# Patient Record
Sex: Male | Born: 1975 | Race: White | Marital: Married | Smoking: Former smoker
Health system: Southern US, Community
[De-identification: ages and names within clinical notes are randomized; demographics above are authoritative.]

## PROBLEM LIST (undated history)

## (undated) DIAGNOSIS — T7840XA Allergy, unspecified, initial encounter: Secondary | ICD-10-CM

## (undated) DIAGNOSIS — J45909 Unspecified asthma, uncomplicated: Secondary | ICD-10-CM

## (undated) DIAGNOSIS — K219 Gastro-esophageal reflux disease without esophagitis: Secondary | ICD-10-CM

## (undated) DIAGNOSIS — M722 Plantar fascial fibromatosis: Secondary | ICD-10-CM

## (undated) DIAGNOSIS — G4730 Sleep apnea, unspecified: Secondary | ICD-10-CM

## (undated) DIAGNOSIS — G4733 Obstructive sleep apnea (adult) (pediatric): Secondary | ICD-10-CM

## (undated) DIAGNOSIS — M5137 Other intervertebral disc degeneration, lumbosacral region: Secondary | ICD-10-CM

## (undated) DIAGNOSIS — G5603 Carpal tunnel syndrome, bilateral upper limbs: Secondary | ICD-10-CM

## (undated) DIAGNOSIS — Z9989 Dependence on other enabling machines and devices: Principal | ICD-10-CM

## (undated) HISTORY — DX: Other intervertebral disc degeneration, lumbosacral region: M51.37

## (undated) HISTORY — DX: Gastro-esophageal reflux disease without esophagitis: K21.9

## (undated) HISTORY — DX: Unspecified asthma, uncomplicated: J45.909

## (undated) HISTORY — PX: OTHER SURGICAL HISTORY: SHX169

## (undated) HISTORY — DX: Carpal tunnel syndrome, bilateral upper limbs: G56.03

## (undated) HISTORY — PX: COLONOSCOPY: SHX174

## (undated) HISTORY — DX: Dependence on other enabling machines and devices: Z99.89

## (undated) HISTORY — DX: Plantar fascial fibromatosis: M72.2

## (undated) HISTORY — DX: Obstructive sleep apnea (adult) (pediatric): G47.33

## (undated) HISTORY — DX: Allergy, unspecified, initial encounter: T78.40XA

## (undated) HISTORY — DX: Sleep apnea, unspecified: G47.30

---

## 2015-04-11 ENCOUNTER — Other Ambulatory Visit: Payer: Self-pay | Admitting: Orthopedic Surgery

## 2015-04-11 DIAGNOSIS — G8929 Other chronic pain: Secondary | ICD-10-CM

## 2015-04-11 DIAGNOSIS — M545 Low back pain: Principal | ICD-10-CM

## 2015-05-09 ENCOUNTER — Ambulatory Visit
Admission: RE | Admit: 2015-05-09 | Discharge: 2015-05-09 | Disposition: A | Discharge status: Home / Self Care | Payer: BLUE CROSS/BLUE SHIELD | Source: Ambulatory Visit | Attending: Orthopedic Surgery | Admitting: Orthopedic Surgery

## 2015-05-09 ENCOUNTER — Other Ambulatory Visit: Payer: Self-pay | Admitting: Orthopedic Surgery

## 2015-05-09 VITALS — BP 154/72 | HR 85 | Temp 97.7°F | Resp 20

## 2015-05-09 DIAGNOSIS — G8929 Other chronic pain: Secondary | ICD-10-CM

## 2015-05-09 DIAGNOSIS — M545 Low back pain, unspecified: Secondary | ICD-10-CM

## 2015-05-09 MED ORDER — FENTANYL CITRATE (PF) 100 MCG/2ML IJ SOLN
25.0000 ug | INTRAMUSCULAR | Status: DC | PRN
  Administered 2015-05-09: 100 ug via INTRAVENOUS

## 2015-05-09 MED ORDER — CEFAZOLIN SODIUM-DEXTROSE 2-3 GM-% IV SOLR
2.0000 g | Freq: Once | INTRAVENOUS | Status: AC
  Administered 2015-05-09: 2 g via INTRAVENOUS

## 2015-05-09 MED ORDER — KETOROLAC TROMETHAMINE 30 MG/ML IJ SOLN
30.0000 mg | Freq: Once | INTRAMUSCULAR | Status: AC
  Administered 2015-05-09: 30 mg via INTRAVENOUS

## 2015-05-09 MED ORDER — MEPERIDINE HCL 100 MG/ML IJ SOLN
100.0000 mg | Freq: Once | INTRAMUSCULAR | Status: AC
  Administered 2015-05-09: 100 mg via INTRAMUSCULAR

## 2015-05-09 MED ORDER — IOHEXOL 180 MG/ML  SOLN: 3.5000 mL | Freq: Once | INTRAMUSCULAR | Status: DC | PRN

## 2015-05-09 MED ORDER — SODIUM CHLORIDE 0.9 % IV SOLN
Freq: Once | INTRAVENOUS | Status: AC
  Administered 2015-05-09: 08:00:00 via INTRAVENOUS

## 2015-05-09 MED ORDER — MIDAZOLAM HCL 2 MG/2ML IJ SOLN
1.0000 mg | INTRAMUSCULAR | Status: DC | PRN
  Administered 2015-05-09: 1 mg via INTRAVENOUS

## 2015-05-09 MED ORDER — ONDANSETRON HCL 4 MG/2ML IJ SOLN
4.0000 mg | Freq: Once | INTRAMUSCULAR | Status: AC
Start: 2015-05-09 — End: 2015-05-09
  Administered 2015-05-09: 4 mg via INTRAMUSCULAR

## 2015-05-09 NOTE — Discharge Instructions (Signed)
Discogram Post Procedure Discharge Instructions ° °1. May resume a regular diet and any medications that you routinely take (including pain medications). °2. No driving day of procedure. °3. Upon discharge go home and rest for at least 4 hours.  May use an ice pack as needed to injection sites on back.  Ice to back 30 minutes on and 30 minutes off, all day. °4. May remove bandades later, today. °5. It is not unusual to be sore for several days after this procedure. ° ° ° °Please contact our office at [phone] for the following symptoms: ° °· Fever greater than 100 degrees °· Increased swelling, pain, or redness at injection site. ° ° °Thank you for visiting [facility]. ° ° °

## 2015-11-08 ENCOUNTER — Encounter
Payer: BLUE CROSS/BLUE SHIELD | Attending: Physical Medicine & Rehabilitation | Admitting: Physical Medicine & Rehabilitation

## 2015-11-08 ENCOUNTER — Encounter: Payer: Self-pay | Admitting: Physical Medicine & Rehabilitation

## 2015-11-08 VITALS — BP 147/81 | HR 77

## 2015-11-08 DIAGNOSIS — R200 Anesthesia of skin: Secondary | ICD-10-CM | POA: Insufficient documentation

## 2015-11-08 DIAGNOSIS — G8929 Other chronic pain: Secondary | ICD-10-CM | POA: Insufficient documentation

## 2015-11-08 DIAGNOSIS — R202 Paresthesia of skin: Secondary | ICD-10-CM | POA: Diagnosis not present

## 2015-11-08 DIAGNOSIS — Z87891 Personal history of nicotine dependence: Secondary | ICD-10-CM | POA: Insufficient documentation

## 2015-11-08 DIAGNOSIS — G894 Chronic pain syndrome: Secondary | ICD-10-CM | POA: Diagnosis not present

## 2015-11-08 DIAGNOSIS — M545 Low back pain: Secondary | ICD-10-CM | POA: Insufficient documentation

## 2015-11-08 DIAGNOSIS — M51379 Other intervertebral disc degeneration, lumbosacral region without mention of lumbar back pain or lower extremity pain: Secondary | ICD-10-CM | POA: Insufficient documentation

## 2015-11-08 DIAGNOSIS — M5137 Other intervertebral disc degeneration, lumbosacral region: Secondary | ICD-10-CM | POA: Diagnosis not present

## 2015-11-08 HISTORY — DX: Other intervertebral disc degeneration, lumbosacral region: M51.37

## 2015-11-08 HISTORY — DX: Other intervertebral disc degeneration, lumbosacral region without mention of lumbar back pain or lower extremity pain: M51.379

## 2015-11-08 NOTE — Progress Notes (Signed)
Subjective:    Patient ID: James Webster, male    DOB: 01-23-1976, 40 y.o.   MRN: [id number]  HPI  39 year old male with past medical of DDD, surgical history of low back pain presents for evaluation. It started in summer 2013.  He denies inciting events.  He had some trauma soon after that exacerbated the symptoms.  The pain is progressively worse since 2015.  Heat, TENs, and rest improve the pain.  Prolonged activity and postures exacerbate the pain.  He describes the pain as achy, but can be sharp.  He has ?radiation to his posterior thigh.  Denies associated weakness, numbness, tingling.  Pain is constant.  6/10.  He does not like taking medications, but has taken flexeril, hydrocodone, tramadol at times, which helps.  He now has a sedentary job.   He has had ESIs in the past, but did not receive benefit. He had an MRI which showed mild to moderate degeneration at L5-S1. He is not a surgical candidate. He also physical therapy in the past without significant benefit. He also had a discogram, which had non-specific findings per pt. He saw a pain doctor, but did not feel comfortable, so never went back.   The pain inhibits pt from doing activities that he enjoys in life.  Pain Inventory Average Pain 8 Pain Right Now 8 My pain is sharp, stabbing and aching  In the last 24 hours, has pain interfered with the following? General activity 7 Relation with others 5 Enjoyment of life 8 What TIME of day is your pain at its worst? morning and evening Sleep (in general) Poor  Pain is worse with: walking, bending, sitting and standing Pain improves with: rest, heat/ice, medication and TENS Relief from Meds: 7  Mobility walk without assistance ability to climb steps?  yes do you drive?  yes  Function employed # of hrs/week 40 what is your job? [employer]  Neuro/Psych numbness tingling  Prior Studies Any changes since last visit?  no  Physicians involved in your care Any  changes since last visit?  no   History reviewed. No pertinent family history. Social History   Social History  . Marital Status: Married    Spouse Name: N/A  . Number of Children: N/A  . Years of Education: N/A   Social History Main Topics  . Smoking status: Former Smoker -- 1.00 packs/day for 3 years    Types: Cigarettes    Quit date: 05/08/1996  . Smokeless tobacco: Never Used  . Alcohol Use: None  . Drug Use: None  . Sexual Activity: Not Asked   Other Topics Concern  . None   Social History Narrative   History reviewed. No pertinent past surgical history. History reviewed. No pertinent past medical history. BP 147/81 mmHg  Pulse 77  SpO2 98%  Opioid Risk Score:   Fall Risk Score:  `1  Depression screen PHQ 2/9  Depression screen PHQ 2/9 11/08/2015  Decreased Interest 1  Down, Depressed, Hopeless 0  PHQ - 2 Score 1  Altered sleeping 1  Tired, decreased energy 1  Change in appetite 0  Feeling bad or failure about yourself  0  Trouble concentrating 0  Moving slowly or fidgety/restless 0  Suicidal thoughts 0  PHQ-9 Score 3  Difficult doing work/chores Not difficult at all   Review of Systems  Constitutional: Negative for fever and chills.  Musculoskeletal: Positive for back pain. Negative for neck pain.  Neurological: Negative for weakness.  All other  systems reviewed and are negative.     Objective:   Physical Exam HENT: Normocephalic, Atraumatic Eyes: EOMI, Conj WNL Cardio: S1, S2 normal, RRR Pulm: B/l clear to auscultation.  Effort normal Abd: Soft, non-distended, non-tender, BS+ MSK:  Gait WNL.   TTP along lumbar PSP.    No edema.   ?FABERs b/l.  +Stork test  Neg Fortin's finger Neuro: CN II-XII grossly intact.    Sensation intact to light touch in all LE dermatomes  Reflexes 2+ throughout  Strength  5/5 in all LE myotomes  Skin: Warm and Dry    Assessment & Plan:  40 year old male with past medical of DDD, surgical history of low back  pain presents for evaluation.  1. Chronic mechanical low back pain  Has tried ESIs, Discogram and is not a surgical candidate  Cont heat and TENs  Pt will consider pool therapy at local [facility]  Referral to PT for exercises and possible traction  Pt does not currently require pain meds  Encouraged pt to use back brace for strenuous activity  Encouraged pt to cont ergonomic adjustments at work, potential use of standing desk in addition to sitting  Encouraged stretching during work  Will refer pt for [employer] consider facet injections in future  Will consider trigger point injections in future  2. Morbid obesity  Encouraged weight loss  Encouraged activity  Time spent with pt: 55 min, time counseling 40 minutes.

## 2015-12-12 DIAGNOSIS — Z0000 Encounter for general adult medical examination without abnormal findings: Secondary | ICD-10-CM | POA: Diagnosis not present

## 2015-12-13 DIAGNOSIS — J40 Bronchitis, not specified as acute or chronic: Secondary | ICD-10-CM | POA: Diagnosis not present

## 2015-12-13 DIAGNOSIS — J020 Streptococcal pharyngitis: Secondary | ICD-10-CM | POA: Diagnosis not present

## 2015-12-13 DIAGNOSIS — Z6841 Body Mass Index (BMI) 40.0 and over, adult: Secondary | ICD-10-CM | POA: Diagnosis not present

## 2015-12-17 DIAGNOSIS — Z23 Encounter for immunization: Secondary | ICD-10-CM | POA: Diagnosis not present

## 2015-12-17 DIAGNOSIS — M549 Dorsalgia, unspecified: Secondary | ICD-10-CM | POA: Diagnosis not present

## 2015-12-17 DIAGNOSIS — Z6841 Body Mass Index (BMI) 40.0 and over, adult: Secondary | ICD-10-CM | POA: Diagnosis not present

## 2015-12-17 DIAGNOSIS — Z0000 Encounter for general adult medical examination without abnormal findings: Secondary | ICD-10-CM | POA: Diagnosis not present

## 2015-12-17 DIAGNOSIS — J020 Streptococcal pharyngitis: Secondary | ICD-10-CM | POA: Diagnosis not present

## 2015-12-17 DIAGNOSIS — Z1389 Encounter for screening for other disorder: Secondary | ICD-10-CM | POA: Diagnosis not present

## 2015-12-19 ENCOUNTER — Encounter
Payer: BLUE CROSS/BLUE SHIELD | Attending: Physical Medicine & Rehabilitation | Admitting: Physical Medicine & Rehabilitation

## 2015-12-19 ENCOUNTER — Encounter: Payer: Self-pay | Admitting: Physical Medicine & Rehabilitation

## 2015-12-19 VITALS — BP 129/86 | Resp 14

## 2015-12-19 DIAGNOSIS — R202 Paresthesia of skin: Secondary | ICD-10-CM | POA: Insufficient documentation

## 2015-12-19 DIAGNOSIS — M545 Low back pain, unspecified: Secondary | ICD-10-CM

## 2015-12-19 DIAGNOSIS — R200 Anesthesia of skin: Secondary | ICD-10-CM | POA: Insufficient documentation

## 2015-12-19 DIAGNOSIS — Z87891 Personal history of nicotine dependence: Secondary | ICD-10-CM | POA: Insufficient documentation

## 2015-12-19 DIAGNOSIS — G8929 Other chronic pain: Secondary | ICD-10-CM | POA: Insufficient documentation

## 2015-12-19 DIAGNOSIS — IMO0001 Reserved for inherently not codable concepts without codable children: Secondary | ICD-10-CM

## 2015-12-19 DIAGNOSIS — M791 Myalgia: Secondary | ICD-10-CM

## 2015-12-19 DIAGNOSIS — G894 Chronic pain syndrome: Secondary | ICD-10-CM | POA: Diagnosis not present

## 2015-12-19 DIAGNOSIS — M609 Myositis, unspecified: Secondary | ICD-10-CM

## 2015-12-19 DIAGNOSIS — M5137 Other intervertebral disc degeneration, lumbosacral region: Secondary | ICD-10-CM | POA: Diagnosis not present

## 2015-12-19 NOTE — Progress Notes (Addendum)
Subjective:    Patient ID: James Webster, male    DOB: 04-Aug-1975, 40 y.o.   MRN: [id number]  HPI  40 year old male with past medical of DDD, surgical history of low back pain presents for for follow up. It started in summer 2013.  He denies inciting events.  He had some trauma soon after that exacerbated the symptoms.  The pain is progressively worse since 2015.  Heat, TENs, and rest improve the pain.  Prolonged activity and postures exacerbate the pain.  He describes the pain as achy, but can be sharp.  He has ?radiation to his posterior thigh.  Denies associated weakness, numbness, tingling.  Pain is constant.  He does not like taking medications, but has taken flexeril, hydrocodone, tramadol at times, which helps.  He now has a sedentary job.    He has had ESIs in the past, but did not receive benefit. He had an MRI which showed mild to moderate degeneration at L5-S1. He is not a surgical candidate. He also had physical therapy in the past without significant benefit. He also had a discogram, which had non-specific findings per pt. He saw a pain doctor, but did not feel comfortable, so never went back.   Last clinic visit 11/08/15.  Today, severity 4/10.  Since last visit, his pain is about the same.  He fell off a step on a ladder since last visit.  He has been doing Biomedical scientist.  He is on a steroids due to strep throat, which seems to improve the pain.  He has not followed up with pool therapy.  He did not go see PT.  He has not purchased a back brace.  His work place has made ergonomic adjustments, which has been helping.  He tries to stretch during work, but does not always manage to do so.  Pain Inventory Average Pain 7 Pain Right Now 7 My pain is constant, sharp, stabbing and aching  In the last 24 hours, has pain interfered with the following? General activity 7 Relation with others 5 Enjoyment of life 7 What TIME of day is your pain at its worst? evening Sleep (in general)  Poor  Pain is worse with: walking, bending, sitting, inactivity, standing and some activites Pain improves with: rest, heat/ice, medication and TENS Relief from Meds: 4  Mobility walk without assistance how many minutes can you walk? 2 ability to climb steps?  yes Do you have any goals in this area?  no  Function employed # of hrs/week 40 Do you have any goals in this area?  no  Neuro/Psych No problems in this area  Prior Studies Any changes since last visit?  no  Physicians involved in your care Neurosurgeon NA   History reviewed. No pertinent family history. Social History   Social History  . Marital Status: Married    Spouse Name: N/A  . Number of Children: N/A  . Years of Education: N/A   Social History Main Topics  . Smoking status: Former Smoker -- 1.00 packs/day for 3 years    Types: Cigarettes    Quit date: 05/08/1996  . Smokeless tobacco: Never Used  . Alcohol Use: None  . Drug Use: None  . Sexual Activity: Not Asked   Other Topics Concern  . None   Social History Narrative   History reviewed. No pertinent past surgical history. Past Medical History  Diagnosis Date  . DDD (degenerative disc disease), lumbosacral 11/08/2015   BP 129/86 mmHg  Resp 14  SpO2  96%  Opioid Risk Score:   Fall Risk Score:  `1  Depression screen PHQ 2/9  Depression screen [facility] 2/9 12/19/2015 11/08/2015  Decreased Interest 0 1  Down, Depressed, Hopeless 0 0  PHQ - 2 Score 0 1  Altered sleeping - 1  Tired, decreased energy - 1  Change in appetite - 0  Feeling bad or failure about yourself  - 0  Trouble concentrating - 0  Moving slowly or fidgety/restless - 0  Suicidal thoughts - 0  PHQ-9 Score - 3  Difficult doing work/chores - Not difficult at all   Review of Systems  Musculoskeletal: Positive for back pain.  All other systems reviewed and are negative.     Objective:   Physical Exam HENT: Normocephalic, Atraumatic Eyes: EOMI, Conj WNL Cardio: S1, S2  normal, RRR Pulm: B/l clear to auscultation.  Effort normal Abd: Soft, non-distended, non-tender, BS+ MSK:   Gait WNL.               TTP along lumbar PSP.                No edema.               ?FABERs b/l.             +Stork test             Neg Fortin's finger Neuro: CN II-XII grossly intact.                Sensation intact to light touch in all LE dermatomes             Reflexes 2+ throughout             Strength          5/5 in all LE myotomes Skin: Warm and Dry    Assessment & Plan:  40 year old male with past medical of DDD, surgical history of low back pain presents for follow up.  1. Chronic mechanical low back pain             Has tried ESIs, Discogram and is not a surgical candidate             Cont heat and TENs  Ergonomic adjustments at work, providing benefit.              Pt will follow up with pool therapy [facility] (did not do so on last visit)  Cont  stretching during work  Pt to follow up with PT for exercises and possible traction              Pt does not currently require pain meds at present             Encouraged pt to use back brace for strenuous activity (again)  Awaiting [employer]             Will consider facet injections in future  2. Morbid obesity             Encouraged weight loss             Encouraged activity  3. Myalgias  Will perform trigger point injections   Trigger point injection procedure note: Trigger Point Injection: Written consent was obtained for the patient. Trigger points were identified of b/l lumbar paraspinals. The areas were cleaned with alcohol, and each of  these trigger points were injected with 1 cc 0.5% Marcaine. Needle draw back was performed. There were no complications from  the procedure, and it was well tolerated.

## 2015-12-26 ENCOUNTER — Encounter (HOSPITAL_BASED_OUTPATIENT_CLINIC_OR_DEPARTMENT_OTHER): Payer: BLUE CROSS/BLUE SHIELD | Admitting: Physical Medicine & Rehabilitation

## 2015-12-26 ENCOUNTER — Encounter: Payer: Self-pay | Admitting: Physical Medicine & Rehabilitation

## 2015-12-26 VITALS — BP 116/83 | HR 88 | Resp 14

## 2015-12-26 DIAGNOSIS — M545 Low back pain: Secondary | ICD-10-CM | POA: Diagnosis not present

## 2015-12-26 DIAGNOSIS — R200 Anesthesia of skin: Secondary | ICD-10-CM | POA: Diagnosis not present

## 2015-12-26 DIAGNOSIS — G894 Chronic pain syndrome: Secondary | ICD-10-CM | POA: Diagnosis not present

## 2015-12-26 DIAGNOSIS — M609 Myositis, unspecified: Secondary | ICD-10-CM

## 2015-12-26 DIAGNOSIS — G8929 Other chronic pain: Secondary | ICD-10-CM | POA: Diagnosis not present

## 2015-12-26 DIAGNOSIS — M791 Myalgia: Secondary | ICD-10-CM | POA: Diagnosis not present

## 2015-12-26 DIAGNOSIS — Z87891 Personal history of nicotine dependence: Secondary | ICD-10-CM | POA: Diagnosis not present

## 2015-12-26 DIAGNOSIS — M5137 Other intervertebral disc degeneration, lumbosacral region: Secondary | ICD-10-CM | POA: Diagnosis not present

## 2015-12-26 DIAGNOSIS — R202 Paresthesia of skin: Secondary | ICD-10-CM | POA: Diagnosis not present

## 2015-12-26 DIAGNOSIS — IMO0001 Reserved for inherently not codable concepts without codable children: Secondary | ICD-10-CM

## 2015-12-26 NOTE — Progress Notes (Addendum)
Previous trigger point injection with benefit and reduction in pain, although patient states he is unaware how beneficial due to concomitant steroids.   Trigger point injection procedure note: Trigger Point Injection: Written consent was obtained for the patient. Trigger points were identified of b/l upper and lower lumbar paraspinals. The areas were cleaned with alcohol, and each of these trigger points were injected with 1 cc 0.5% Marcaine. Needle draw back was performed. There were no complications from the procedure, and it was well tolerated.

## 2016-01-11 DIAGNOSIS — M25562 Pain in left knee: Secondary | ICD-10-CM | POA: Diagnosis not present

## 2016-01-11 DIAGNOSIS — Z6841 Body Mass Index (BMI) 40.0 and over, adult: Secondary | ICD-10-CM | POA: Diagnosis not present

## 2016-01-14 DIAGNOSIS — M23231 Derangement of other medial meniscus due to old tear or injury, right knee: Secondary | ICD-10-CM | POA: Diagnosis not present

## 2016-01-14 DIAGNOSIS — M25461 Effusion, right knee: Secondary | ICD-10-CM | POA: Diagnosis not present

## 2016-01-17 ENCOUNTER — Encounter: Payer: BLUE CROSS/BLUE SHIELD | Admitting: Physical Medicine & Rehabilitation

## 2016-01-22 DIAGNOSIS — M25561 Pain in right knee: Secondary | ICD-10-CM | POA: Diagnosis not present

## 2016-02-18 DIAGNOSIS — M25561 Pain in right knee: Secondary | ICD-10-CM | POA: Diagnosis not present

## 2016-02-28 DIAGNOSIS — M25561 Pain in right knee: Secondary | ICD-10-CM | POA: Diagnosis not present

## 2016-03-04 DIAGNOSIS — M25561 Pain in right knee: Secondary | ICD-10-CM | POA: Diagnosis not present

## 2016-03-11 DIAGNOSIS — M25561 Pain in right knee: Secondary | ICD-10-CM | POA: Diagnosis not present

## 2016-03-13 DIAGNOSIS — M25561 Pain in right knee: Secondary | ICD-10-CM | POA: Diagnosis not present

## 2016-03-17 DIAGNOSIS — M25561 Pain in right knee: Secondary | ICD-10-CM | POA: Diagnosis not present

## 2016-04-24 DIAGNOSIS — L301 Dyshidrosis [pompholyx]: Secondary | ICD-10-CM | POA: Diagnosis not present

## 2016-04-24 DIAGNOSIS — B356 Tinea cruris: Secondary | ICD-10-CM | POA: Diagnosis not present

## 2016-04-24 DIAGNOSIS — B353 Tinea pedis: Secondary | ICD-10-CM | POA: Diagnosis not present

## 2016-07-30 DIAGNOSIS — R1084 Generalized abdominal pain: Secondary | ICD-10-CM | POA: Diagnosis not present

## 2016-07-30 DIAGNOSIS — Z6841 Body Mass Index (BMI) 40.0 and over, adult: Secondary | ICD-10-CM | POA: Diagnosis not present

## 2016-07-30 DIAGNOSIS — R1011 Right upper quadrant pain: Secondary | ICD-10-CM | POA: Diagnosis not present

## 2016-07-30 DIAGNOSIS — J3489 Other specified disorders of nose and nasal sinuses: Secondary | ICD-10-CM | POA: Diagnosis not present

## 2016-08-01 ENCOUNTER — Other Ambulatory Visit: Payer: Self-pay | Admitting: Internal Medicine

## 2016-08-01 DIAGNOSIS — R1084 Generalized abdominal pain: Secondary | ICD-10-CM

## 2016-08-07 ENCOUNTER — Ambulatory Visit
Admission: RE | Admit: 2016-08-07 | Discharge: 2016-08-07 | Disposition: A | Discharge status: Home / Self Care | Payer: BLUE CROSS/BLUE SHIELD | Source: Ambulatory Visit | Attending: Internal Medicine | Admitting: Internal Medicine

## 2016-08-07 DIAGNOSIS — R1084 Generalized abdominal pain: Secondary | ICD-10-CM

## 2016-08-07 DIAGNOSIS — R1031 Right lower quadrant pain: Secondary | ICD-10-CM | POA: Diagnosis not present

## 2016-08-12 ENCOUNTER — Other Ambulatory Visit: Payer: Self-pay | Admitting: Internal Medicine

## 2016-08-12 DIAGNOSIS — R109 Unspecified abdominal pain: Secondary | ICD-10-CM

## 2016-08-13 ENCOUNTER — Ambulatory Visit
Admission: RE | Admit: 2016-08-13 | Discharge: 2016-08-13 | Disposition: A | Discharge status: Home / Self Care | Payer: BLUE CROSS/BLUE SHIELD | Source: Ambulatory Visit | Attending: Internal Medicine | Admitting: Internal Medicine

## 2016-08-13 DIAGNOSIS — R109 Unspecified abdominal pain: Secondary | ICD-10-CM

## 2016-08-13 DIAGNOSIS — R1031 Right lower quadrant pain: Secondary | ICD-10-CM | POA: Diagnosis not present

## 2016-08-18 ENCOUNTER — Encounter: Payer: Self-pay | Admitting: Gastroenterology

## 2016-08-26 ENCOUNTER — Encounter: Payer: Self-pay | Admitting: Gastroenterology

## 2016-08-26 ENCOUNTER — Ambulatory Visit (INDEPENDENT_AMBULATORY_CARE_PROVIDER_SITE_OTHER): Payer: BLUE CROSS/BLUE SHIELD | Admitting: Gastroenterology

## 2016-08-26 VITALS — BP 110/87 | HR 84 | Ht 69.5 in | Wt 294.2 lb

## 2016-08-26 DIAGNOSIS — R140 Abdominal distension (gaseous): Secondary | ICD-10-CM | POA: Diagnosis not present

## 2016-08-26 DIAGNOSIS — R1013 Epigastric pain: Secondary | ICD-10-CM

## 2016-08-26 DIAGNOSIS — K5900 Constipation, unspecified: Secondary | ICD-10-CM | POA: Diagnosis not present

## 2016-08-26 DIAGNOSIS — R1031 Right lower quadrant pain: Secondary | ICD-10-CM

## 2016-08-26 NOTE — Patient Instructions (Signed)
We will try and obtain your records from [facility] to the basement for your lab kit today  Use FD Donald Prose 1-2 capsules three times a day after meals as needed  Use Benefiber 1 tablespoon three times a day with meals

## 2016-08-26 NOTE — Progress Notes (Signed)
James Webster    [id number]    May 19, 1976  Primary Care Physician:HOLWERDA, Nicki Reaper, MD  Referring Physician: Velna Hatchet, MD [address] [place], [place] [address]  Chief complaint:  Lower abdominal pain  HPI: 41 year old male here for new patient evaluation with complaints of lower abdominal pain right greater than left radiating from the back, worse in the past few months. Patient has relocated from [place], he had endoscopy and colonoscopy about 5-6 years ago in [place], records not available during this visit to review. Denies any nausea, vomiting, or blood per rectum. He does have intermittent bloating and constipation. He has history of back injury with compression disc disease.  Abdominal ultrasound and CT abdomen and pelvis were unremarkable with no findings of acute abnormality   Outpatient Encounter Prescriptions as of 08/26/2016  Medication Sig  . cyclobenzaprine (FLEXERIL) 10 MG tablet Take 1 tablet by mouth at bedtime as needed.  Marland Kitchen HYDROcodone-acetaminophen (NORCO/VICODIN) 5-325 MG tablet Take 1 tablet by mouth every 6 (six) hours as needed for moderate pain.  . [DISCONTINUED] cefdinir (OMNICEF) 300 MG capsule   . [DISCONTINUED] fluticasone (FLONASE) 50 MCG/ACT nasal spray 1-2  Sprays  In nostrils prn  . [DISCONTINUED] lidocaine (XYLOCAINE) 2 % solution   . [DISCONTINUED] predniSONE (STERAPRED UNI-PAK 21 TAB) 10 MG (21) TBPK tablet   . [place] [id number] (90 Base) MCG/ACT AEPB   . [DISCONTINUED] traMADol (ULTRAM) 50 MG tablet Take 1 tablet by mouth every 6 (six) hours as needed.   No facility-administered encounter medications on file as of 08/26/2016.     Allergies as of 08/26/2016  . (No Known Allergies)    Past Medical History:  Diagnosis Date  . DDD (degenerative disc disease), lumbosacral 11/08/2015    Past Surgical History:  Procedure Laterality Date  . none      Family History  Problem Relation Age of Onset  . Arthritis  Mother   . Gallbladder disease Mother   . Healthy Father   . Gallbladder disease Sister   . Heart disease Maternal Grandfather   . Colon cancer Neg Hx   . Stomach cancer Neg Hx   . Rectal cancer Neg Hx   . Esophageal cancer Neg Hx   . Liver cancer Neg Hx     Social History   Social History  . Marital status: Married    Spouse name: N/A  . Number of children: 0  . Years of education: N/A   Occupational History  . [employer]    Social History Main Topics  . Smoking status: Former Smoker    Packs/day: 1.00    Years: 3.00    Types: Cigarettes    Quit date: 05/08/1996  . Smokeless tobacco: Never Used  . Alcohol use 0.0 oz/week     Comment: moderate  . Drug use: No  . Sexual activity: Not on file   Other Topics Concern  . Not on file   Social History Narrative  . No narrative on file      Review of systems: Review of Systems  Constitutional: Negative for fever and chills.  HENT: Negative.   Eyes: Negative for blurred vision.  Respiratory: Negative for cough, shortness of breath and wheezing.   Cardiovascular: Negative for chest pain and palpitations.  Gastrointestinal: as per HPI Genitourinary: Negative for dysuria, urgency, frequency and hematuria.  Musculoskeletal: Positive for myalgias, back pain and joint pain.  Skin: Negative for itching and rash.  Neurological: Negative for dizziness, tremors, focal weakness, seizures and loss of consciousness.  Endo/Heme/Allergies: Positive for seasonal allergies.  Psychiatric/Behavioral: Negative for depression, suicidal ideas and hallucinations.  All other systems reviewed and are negative.   Physical Exam: Vitals:   08/26/16 0837  BP: 110/87  Pulse: 84   Body mass index is 42.83 kg/m. Gen:      No acute distress HEENT:  EOMI, sclera anicteric Neck:     No masses; no thyromegaly Lungs:    Clear to auscultation bilaterally; normal respiratory effort CV:         Regular rate and rhythm; no murmurs Abd:       + bowel sounds; soft, non-tender; no palpable masses, no distension Ext:    No edema; adequate peripheral perfusion Skin:      Warm and dry; no rash Neuro: alert and oriented x 3 Psych: normal mood and affect  Data Reviewed:  Reviewed labs, radiology imaging, old records and pertinent past GI work up   Assessment and Plan/Recommendations:  41 year old male with complaints of lower abdominal pain radiating to the back His pain appears to be more musculoskeletal, could be potentially secondary to sacroileitis or spinal compression disc disease I reviewed the images of CT abdomen and pelvis, does not have any acute abnormality or pathology to explain the etiology of pain Advised patient to follow up with physical therapy Used Flexeril as needed  We'll try to obtain records of prior EGD, colonoscopy and GI workup that was done in [place] in the past  Constipation: Start Benefiber 1 tablespoon 3 times daily with meals Increase fluid intake to 8-10 cups daily  Bloating: Trial of FD guard 1-2 capsules 3 times daily as needed  Greater than 50% of the time used for counseling as well as treatment plan and follow-up. He had multiple questions which were answered to his satisfaction  K. Denzil Magnuson , MD [phone] Mon-Fri 8a-5p [phone] after 5p, weekends, holidays  CC: Velna Hatchet, MD

## 2016-09-09 ENCOUNTER — Ambulatory Visit (INDEPENDENT_AMBULATORY_CARE_PROVIDER_SITE_OTHER): Payer: BLUE CROSS/BLUE SHIELD | Admitting: Neurology

## 2016-09-09 ENCOUNTER — Encounter: Payer: Self-pay | Admitting: Neurology

## 2016-09-09 VITALS — BP 118/82 | HR 82 | Resp 18 | Ht 69.5 in | Wt 295.0 lb

## 2016-09-09 DIAGNOSIS — R51 Headache: Secondary | ICD-10-CM

## 2016-09-09 DIAGNOSIS — R635 Abnormal weight gain: Secondary | ICD-10-CM | POA: Diagnosis not present

## 2016-09-09 DIAGNOSIS — R519 Headache, unspecified: Secondary | ICD-10-CM

## 2016-09-09 DIAGNOSIS — G4733 Obstructive sleep apnea (adult) (pediatric): Secondary | ICD-10-CM

## 2016-09-09 DIAGNOSIS — R400 Somnolence: Secondary | ICD-10-CM | POA: Diagnosis not present

## 2016-09-09 DIAGNOSIS — R002 Palpitations: Secondary | ICD-10-CM

## 2016-09-09 NOTE — Patient Instructions (Signed)

## 2016-09-09 NOTE — Progress Notes (Signed)
Subjective:    Patient ID: James Webster is a 41 y.o. male.  HPI    Star Age, MD, PhD [facility] [address] [place], [place] [address]  Dear Dr. Ardeth Perfect,   I saw your patient, James Webster, upon your kind request, in my neurologic clinic today for initial consultation of his sleep disorder, in particular, concern for underlying obstructive sleep apnea. The patient is unaccompanied today. As you know, James Webster is a 41 year old right-handed gentleman with an underlying medical history of degenerative disc disease, abdominal pain, chronic constipation, and morbid obesity, who reports snoring, witnessed apneic breathing pauses while asleep and morning headaches as well as daytime somnolence. I reviewed your office note from 07/30/2016 as well as phone note from 08/14/2016. His Epworth sleepiness score is 9 out of 24, his fatigue score is 48 out of 63. He is married and lives with his wife, they have no children, 1 cat. They watch TV in bed and put the TV on a timer for 90 minutes or 120 minutes typically. 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 He works from 1 PM to 10 PM, bedtime is therefore late, between midnight and 3 AM typically. He does not have trouble going to sleep but has sleep disruption and wakes up with a sense of gasping for air, has witnessed apneas per wife, has occasional morning headaches, sometimes he has to take ibuprofen for his headaches, no history of migraines. He is not aware of any family history of OSA, denies restless leg symptoms or leg twitching at night, sometimes has woken up with palpitations. He has no night to night nocturia. He quit smoking in 1997, drinks alcohol about twice a week, drinks caffeine in the form of coffee, about 6 cups per day, has occasional vivid dreams but typically no nightmares. He moved to [place] in November 2016. 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 He has always been active and physically quite fit and athletic. He has been gaining weight since he has taken up a sedentary job. This is the highest weight he has ever been. He has a history of right knee injury which does limit his mobility. He also has low back pain, had MRI testing, saw orthopedics, has received epidural steroid injections and also saw pain management doctor. He takes Norco very occasionally, has a prescription from 2016. He takes Flexeril occasionally as well. He has been a poor sleeper for years, he has been snoring loudly for years. He does not remember the last time he felt really well about his sleep.  His Past Medical History Is Significant For: Past Medical History:  Diagnosis Date  . DDD (degenerative disc disease), lumbosacral 11/08/2015    His Past Surgical History Is Significant For: Past Surgical History:  Procedure Laterality Date  . none      His Family History Is Significant For: Family History  Problem Relation Age of Onset  . Arthritis Mother   . Gallbladder disease Mother   . Healthy Father   . Gallbladder disease Sister   . Heart disease Maternal Grandfather   . Colon cancer Neg Hx   . Stomach cancer Neg Hx   . Rectal cancer Neg Hx   . Esophageal cancer Neg Hx   . Liver cancer Neg Hx     His Social History Is Significant For: Social History   Social History  .  Marital status: Married    Spouse name: N/A  . Number of children: 0  . Years of education: college   Occupational History  . [employer]    Social History Main Topics  . Smoking status: Former Smoker    Packs/day: 1.00    Years: 3.00    Types: Cigarettes    Quit date: 05/08/1996  . Smokeless tobacco: Never Used  . Alcohol use 0.0 oz/week     Comment: moderate  . Drug use: No  . Sexual activity: Not Asked   Other Topics Concern  . None   Social History Narrative   Drinks about 1/2 pot of coffee a day     His Allergies  Are:  No Known Allergies:   His Current Medications Are:  Outpatient Encounter Prescriptions as of 09/09/2016  Medication Sig  . cyclobenzaprine (FLEXERIL) 10 MG tablet Take 1 tablet by mouth at bedtime as needed.  . [DISCONTINUED] HYDROcodone-acetaminophen (NORCO/VICODIN) 5-325 MG tablet Take 1 tablet by mouth every 6 (six) hours as needed for moderate pain.   No facility-administered encounter medications on file as of 09/09/2016.   :  Review of Systems:  Out of a complete 14 point review of systems, all are reviewed and negative with the exception of these symptoms as listed below: Review of Systems  Neurological:       Patient has trouble staying asleep, snores, witnessed apnea, wakes up feeling tired, morning headaches, daytime fatigue, denies taking naps.    Epworth Sleepiness Scale 0= would never doze 1= slight chance of dozing 2= moderate chance of dozing 3= high chance of dozing  Sitting and reading:3 Watching TV:2 Sitting inactive in a public place (ex. Theater or meeting):0 As a passenger in a car for an hour without a break:1 Lying down to rest in the afternoon:2 Sitting and talking to someone:0 Sitting quietly after lunch (no alcohol):1 In a car, while stopped in traffic:0 Total:9  Objective:  Neurologic Exam  Physical Exam Physical Examination:   Vitals:   09/09/16 0840  BP: 118/82  Pulse: 82  Resp: 18    General Examination: The patient is a very pleasant 41 y.o. male in no acute distress. He appears well-developed and well-nourished and adequately groomed.   HEENT: Normocephalic, atraumatic, pupils are equal, round and reactive to light and accommodation. Funduscopic exam is normal with sharp disc margins noted. Extraocular tracking is good without limitation to gaze excursion or nystagmus noted. Normal smooth pursuit is noted. Hearing is grossly intact. Tympanic membranes are clear bilaterally. Face is symmetric with normal facial animation and normal  facial sensation. Speech is clear with no dysarthria noted. There is no hypophonia. There is no lip, neck/head, jaw or voice tremor. Neck is supple with full range of passive and active motion. There are no carotid bruits on auscultation. Oropharynx exam reveals: mild mouth dryness, adequate dental hygiene and marked airway crowding, due to thicker soft palate, tonsils are 3+ bilaterally, uvula large and mildly irritated and swollen appearing. Mallampati is class II. Tongue protrudes centrally and palate elevates symmetrically. Neck circumference is 19 inches. He has a mild overbite.  Chest: Clear to auscultation without wheezing, rhonchi or crackles noted.  Heart: S1+S2+0, regular and normal without murmurs, rubs or gallops noted.   Abdomen: Soft, non-tender and non-distended with normal bowel sounds appreciated on auscultation.  Extremities: There is no pitting edema in the distal lower extremities bilaterally. Pedal pulses are intact.  Skin: Warm and dry without trophic changes noted. There  are no varicose veins.  Musculoskeletal: exam reveals no obvious joint deformities, tenderness or joint swelling or erythema, with the exception of mild knee swelling of the right knee in the medial aspect, mild warmth noted.   Neurologically:  Mental status: The patient is awake, alert and oriented in all 4 spheres. His immediate and remote memory, attention, language skills and fund of knowledge are appropriate. There is no evidence of aphasia, agnosia, apraxia or anomia. Speech is clear with normal prosody and enunciation. Thought process is linear. Mood is normal and affect is normal.  Cranial nerves II - XII are as described above under HEENT exam. In addition: shoulder shrug is normal with equal shoulder height noted. Motor exam: Normal bulk, strength and tone is noted. There is no drift, tremor or rebound. Romberg is negative. Reflexes are 1+ throughout. Fine motor skills and coordination: intact with  normal finger taps, normal hand movements, normal rapid alternating patting, normal foot taps and normal foot agility.  Cerebellar testing: No dysmetria or intention tremor on finger to nose testing. Heel to shin is unremarkable bilaterally. There is no truncal or gait ataxia.  Sensory exam: intact to light touch, pinprick, vibration, temperature sense in the upper and lower extremities.  Gait, station and balance: He stands easily. No veering to one side is noted. No leaning to one side is noted. Posture is age-appropriate and stance is narrow based. Gait shows normal stride length and normal pace. No problems turning are noted. Tandem walk is unremarkable.   Assessment and Plan:  In summary, James Webster is a very pleasant 41 y.o.-year old male with an underlying medical history of degenerative disc disease, abdominal pain, chronic constipation, R knee pain, and morbid obesity, whose history and physical exam are in keeping with obstructive sleep apnea (OSA), in that he reports loud snoring, witnessed apneas, morning headaches, nonrestorative sleep, sleep disruption and daytime tiredness, and on examination he has a crowded airway and enlarged next size, BMI of over 42. I had a long chat with the patient about my findings and the diagnosis of OSA, its prognosis and treatment options. We talked about medical treatments, surgical interventions and non-pharmacological approaches. I explained in particular the risks and ramifications of untreated moderate to severe OSA, especially with respect to developing cardiovascular disease down the Road, including congestive heart failure, difficult to treat hypertension, cardiac arrhythmias, or stroke. Even type 2 diabetes has, in part, been linked to untreated OSA. Symptoms of untreated OSA include daytime sleepiness, memory problems, mood irritability and mood disorder such as depression and anxiety, lack of energy, as well as recurrent headaches, especially  morning headaches. We talked about trying to maintain a healthy lifestyle in general, as well as the importance of weight control. I encouraged the patient to eat healthy, exercise daily and keep well hydrated, to keep a scheduled bedtime and wake time routine, to not skip any meals and eat healthy snacks in between meals. I advised the patient not to drive when feeling sleepy. I recommended the following at this time: sleep study with potential positive airway pressure titration. (We will score hypopneas at 3%).   I explained the sleep test procedure to the patient and also outlined possible surgical and non-surgical treatment options of OSA, including the use of a custom-made dental device (which would require a referral to a specialist dentist or oral surgeon), upper airway surgical options, such as pillar implants, radiofrequency surgery, tongue base surgery, and UPPP (which would involve a referral to an ENT  Psychologist, sport and exercise). Rarely, jaw surgery such as mandibular advancement may be considered.  I also explained the CPAP treatment option to the patient, who indicated that he would be willing to try CPAP if the need arises. I explained the importance of being compliant with PAP treatment, not only for insurance purposes but primarily to improve His symptoms, and for the patient's long term health benefit, including to reduce His cardiovascular risks. I answered all his questions today and the patient was in agreement. I would like to see him back after the sleep study is completed and encouraged him to call with any interim questions, concerns, problems or updates.   Thank you very much for allowing me to participate in the care of this nice patient. If I can be of any further assistance to you please do not hesitate to call me at [phone].  Sincerely,   Star Age, MD, PhD

## 2016-10-05 ENCOUNTER — Ambulatory Visit (INDEPENDENT_AMBULATORY_CARE_PROVIDER_SITE_OTHER): Payer: BLUE CROSS/BLUE SHIELD | Admitting: Neurology

## 2016-10-05 DIAGNOSIS — G4733 Obstructive sleep apnea (adult) (pediatric): Secondary | ICD-10-CM

## 2016-10-15 ENCOUNTER — Telehealth: Payer: Self-pay

## 2016-10-15 NOTE — Procedures (Signed)
PATIENT'S NAME:  James Webster, James Webster DOB:      12-16-1975      MR#:    [id number]     DATE OF RECORDING: 10/05/2016 REFERRING M.D.:  Velna Hatchet, MD Study Performed:   Baseline Polysomnogram HISTORY: 41 year old man with a history of degenerative disc disease, abdominal pain, chronic constipation, and morbid obesity, who reports snoring, witnessed apneic breathing pauses while asleep and morning headaches as well as daytime somnolence. His Epworth sleepiness score is 9 out of 24, his fatigue score is 48 out of 63. The patient endorsed the Epworth Sleepiness Scale at 9 points. The patient's weight 295 pounds with a height of 70 (inches), resulting in a BMI of 42.8 kg/m2. The patient's neck circumference measured 19 inches.  CURRENT MEDICATIONS: Flexiril   PROCEDURE:  This is a multichannel digital polysomnogram utilizing the Somnostar 11.2 system.  Electrodes and sensors were applied and monitored per AASM Specifications.   EEG, EOG, Chin and Limb EMG, were sampled at 200 Hz.  ECG, Snore and Nasal Pressure, Thermal Airflow, Respiratory Effort, CPAP Flow and Pressure, Oximetry was sampled at 50 Hz. Digital video and audio were recorded.      BASELINE STUDY  Lights Out was at 22:33 and Lights On at 05:02.  Total recording time (TRT) was 389.5 minutes, with a total sleep time (TST) of  372.5 minutes. The patient's sleep latency was 12 minutes, REM latency was 150.5 minutes, which is prolonged.  The sleep efficiency was 95.6 %.     SLEEP ARCHITECTURE: WASO (Wake after sleep onset) was 5 minutes.  There were 25.5 minutes in Stage N1, 217 minutes Stage N2, 54.5 minutes Stage N3 and 75.5 minutes in Stage REM.  The percentage of Stage N1 was 6.8%, Stage N2 was 58.3%, which is mildly increased, Stage N3 was 14.6%, which is near normal,  and Stage R (REM sleep) was 20.3%, which is normal.   The arousals were noted as: 14 were spontaneous, 0 were associated with PLMs, 47 were associated with respiratory  events.    Audio and video analysis did not show any abnormal or unusual movements, behaviors, phonations or vocalizations.   The patient took no bathroom breaks. Mild to moderate snoring was noted. The EKG was in keeping with normal sinus rhythm (NSR).  RESPIRATORY ANALYSIS:  There were a total of 141 respiratory events:  35 obstructive apneas, 1 central apneas and 0 mixed apneas with a total of 36 apneas and an apnea index (AI) of 5.8 /hour. There were 105 hypopneas with a hypopnea index of 16.9 /hour. The patient also had 0 respiratory event related arousals (RERAs).      The total APNEA/HYPOPNEA INDEX (AHI) was 22.7/hour and the total RESPIRATORY DISTURBANCE INDEX was 22.7 /hour.  83 events occurred in REM sleep and 114 events in NREM. The REM AHI was 66. /hour, versus a non-REM AHI of 11.7. The patient spent 372.5 minutes of total sleep time in the supine position and 0 minutes in non-supine.. The supine AHI was 22.7 versus a non-supine AHI of 0.0.  OXYGEN SATURATION & C02:  The Wake baseline 02 saturation was 94%, with the lowest being 79%. Time spent below 89% saturation equaled 24 minutes.  PERIODIC LIMB MOVEMENTS: The patient had a total of 0 Periodic Limb Movements.  The Periodic Limb Movement (PLM) index was 0 and the PLM Arousal index was 0/hour.  Post-study, the patient indicated that sleep was the same as usual.   IMPRESSION:  1.  Obstructive Sleep Apnea (  OSA)  RECOMMENDATIONS:  1. This study demonstrates overall moderate to severe obstructive sleep apnea, with a total AHI of 22.7/hour, REM AHI of 66/hour, and O2 nadir of 79%. Given the patient's medical history and sleep related complaints, a full-night CPAP titration study is recommended to optimize therapy. Other treatment options may include avoidance of supine sleep position along with weight loss, upper airway or jaw surgery in selected patients or the use of an oral appliance in certain patients. ENT evaluation and/or  consultation with a maxillofacial surgeon or dentist may be feasible in some instances.    2. Please note that untreated obstructive sleep apnea carries additional perioperative morbidity. Patients with significant obstructive sleep apnea should receive perioperative PAP therapy and the surgeons and particularly the anesthesiologist should be informed of the diagnosis and the severity of the sleep disordered breathing. 3. The patient should be cautioned not to drive, work at heights, or operate dangerous or heavy equipment when tired or sleepy. Review and reiteration of good sleep hygiene measures should be pursued with any patient. 4. The patient will be seen in follow-up by Dr. Rexene Alberts at [facility] for discussion of the test results and further management strategies. The referring provider will be notified of the test results.  I certify that I have reviewed the entire raw data recording prior to the issuance of this report in accordance with the Standards of Accreditation of the American Academy of Sleep Medicine (AASM)   Star Age, MD, PhD Diplomat, American Board of Psychiatry and Neurology (Neurology and Sleep Medicine)

## 2016-10-15 NOTE — Addendum Note (Signed)
Addended by: Star Age on: 10/15/2016 08:11 AM   Modules accepted: Orders

## 2016-10-15 NOTE — Telephone Encounter (Signed)
-----   Message from Star Age, MD sent at 10/15/2016  8:11 AM EDT ----- Patient referred by Dr. Ardeth Perfect, seen by me on 09/09/16, diagnostic PSG on 10/05/16.    Please call and notify the patient that the recent sleep study did confirm the diagnosis of mod to severe obstructive sleep apnea and that I recommend treatment for this in the form of CPAP. This will require a repeat sleep study for proper titration and mask fitting. Please explain to patient and arrange for a CPAP titration study. I have placed an order in the chart. Thanks, and please route to [facility] for scheduling next sleep study.  Star Age, MD, PhD [facility])

## 2016-10-15 NOTE — Telephone Encounter (Signed)
I called pt. I advised pt that Dr. Rexene Alberts reviewed their sleep study results and found that pt has moderate to severe sleep apnea. Dr. Rexene Alberts recommends that pt return for a cpap titration study. Pt is agreeable to returning for a titration study. I advised pt that our sleep lab will be calling pt to schedule this titration study as soon as they hear back after filing with pt's insurance. Pt verbalized understanding of results. Pt had no questions at this time but was encouraged to call back if questions arise.

## 2016-10-15 NOTE — Progress Notes (Signed)
Patient referred by Dr. Ardeth Perfect, seen by me on 09/09/16, diagnostic PSG on 10/05/16.    Please call and notify the patient that the recent sleep study did confirm the diagnosis of mod to severe obstructive sleep apnea and that I recommend treatment for this in the form of CPAP. This will require a repeat sleep study for proper titration and mask fitting. Please explain to patient and arrange for a CPAP titration study. I have placed an order in the chart. Thanks, and please route to [facility] for scheduling next sleep study.  Star Age, MD, PhD [facility])

## 2016-10-17 ENCOUNTER — Encounter
Payer: BLUE CROSS/BLUE SHIELD | Attending: Physical Medicine & Rehabilitation | Admitting: Physical Medicine & Rehabilitation

## 2016-10-17 ENCOUNTER — Encounter: Payer: Self-pay | Admitting: Physical Medicine & Rehabilitation

## 2016-10-17 VITALS — BP 132/84 | HR 88

## 2016-10-17 DIAGNOSIS — M545 Low back pain: Secondary | ICD-10-CM | POA: Insufficient documentation

## 2016-10-17 DIAGNOSIS — Z87891 Personal history of nicotine dependence: Secondary | ICD-10-CM | POA: Diagnosis not present

## 2016-10-17 DIAGNOSIS — M791 Myalgia: Secondary | ICD-10-CM | POA: Diagnosis not present

## 2016-10-17 DIAGNOSIS — M5137 Other intervertebral disc degeneration, lumbosacral region: Secondary | ICD-10-CM | POA: Diagnosis not present

## 2016-10-17 DIAGNOSIS — G8929 Other chronic pain: Secondary | ICD-10-CM

## 2016-10-17 DIAGNOSIS — M461 Sacroiliitis, not elsewhere classified: Secondary | ICD-10-CM | POA: Diagnosis not present

## 2016-10-17 DIAGNOSIS — G894 Chronic pain syndrome: Secondary | ICD-10-CM

## 2016-10-17 DIAGNOSIS — M47816 Spondylosis without myelopathy or radiculopathy, lumbar region: Secondary | ICD-10-CM

## 2016-10-17 DIAGNOSIS — M1288 Other specific arthropathies, not elsewhere classified, other specified site: Secondary | ICD-10-CM | POA: Diagnosis not present

## 2016-10-17 MED ORDER — METHOCARBAMOL 500 MG PO TABS: 500.0000 mg | ORAL_TABLET | Freq: Three times a day (TID) | ORAL | 1 refills | Status: DC | PRN

## 2016-10-17 MED ORDER — DICLOFENAC SODIUM 50 MG PO TBEC: 50.0000 mg | DELAYED_RELEASE_TABLET | Freq: Two times a day (BID) | ORAL | 1 refills | Status: DC

## 2016-10-17 NOTE — Progress Notes (Signed)
Subjective:    Patient ID: James Webster, male    DOB: Feb 09, 1976, 41 y.o.   MRN: [id number]  HPI  41 year old male with past medical of DDD, surgical history of low back pain presents for follow up.  Initially stated; It started in summer 2013.  He denies inciting events.  He had some trauma soon after that exacerbated the symptoms.  The pain is progressively worse since 2015.  Heat, TENs, and rest improve the pain.  Prolonged activity and postures exacerbate the pain.  He describes the pain as achy, but can be sharp.  He has ?radiation to his posterior thigh.  Denies associated weakness, numbness, tingling.  Pain is constant. He does not like taking medications, but has taken flexeril, hydrocodone, tramadol at times, which helps.  He now has a sedentary job.  He has had ESIs in the past, but did not receive benefit. He had an MRI which showed mild to moderate degeneration at L5-S1. He is not a surgical candidate. He also physical therapy in the past without significant benefit. He also had a discogram, which had non-specific findings per pt. He saw a pain doctor, but did not feel comfortable, so never went back.   The pain inhibits pt from doing activities that he enjoys in life.   Last clinic visit 12/26/15.  Since last visit, he continues to use heat and TENS unit.  He does not remember going to PT.  He tore his MCL since last visit and has been focusing on that.  Pt tried pool therapy with benefit. Uses back brace with increased activity. Ergonomic adjustment have been made at work.  Pt states he has gained a lot of weight since last visit.  His pain has increased 1-2 months, R>L.   Pain Inventory Average Pain 8 Pain Right Now 8 My pain is sharp, stabbing and aching  In the last 24 hours, has pain interfered with the following? General activity 7 Relation with others 5 Enjoyment of life 8 What TIME of day is your pain at its worst? morning and evening Sleep (in general) Poor  Pain is  worse with: walking, bending, sitting and standing Pain improves with: rest, heat/ice, medication and TENS Relief from Meds: 7  Mobility walk without assistance ability to climb steps?  yes do you drive?  yes  Function employed # of hrs/week 40 what is your job? [employer]  Neuro/Psych numbness tingling  Prior Studies Any changes since last visit?  no  Physicians involved in your care Any changes since last visit?  no   Family History  Problem Relation Age of Onset  . Arthritis Mother   . Gallbladder disease Mother   . Healthy Father   . Gallbladder disease Sister   . Heart disease Maternal Grandfather   . Colon cancer Neg Hx   . Stomach cancer Neg Hx   . Rectal cancer Neg Hx   . Esophageal cancer Neg Hx   . Liver cancer Neg Hx    Social History   Social History  . Marital status: Married    Spouse name: N/A  . Number of children: 0  . Years of education: college   Occupational History  . [employer]    Social History Main Topics  . Smoking status: Former Smoker    Packs/day: 1.00    Years: 3.00    Types: Cigarettes    Quit date: 05/08/1996  . Smokeless tobacco: Never Used  . Alcohol use 0.0 oz/week  Comment: moderate  . Drug use: No  . Sexual activity: Not Asked   Other Topics Concern  . None   Social History Narrative   Drinks about 1/2 pot of coffee a day    Past Surgical History:  Procedure Laterality Date  . none     Past Medical History:  Diagnosis Date  . DDD (degenerative disc disease), lumbosacral 11/08/2015   BP 132/84   Pulse 88   SpO2 96%   Opioid Risk Score:   Fall Risk Score:  `1  Depression screen PHQ 2/9  Depression screen [facility] 2/9 12/26/2015 12/19/2015 11/08/2015  Decreased Interest 0 0 1  Down, Depressed, Hopeless 0 0 0  PHQ - 2 Score 0 0 1  Altered sleeping - - 1  Tired, decreased energy - - 1  Change in appetite - - 0  Feeling bad or failure about yourself  - - 0  Trouble concentrating - - 0  Moving  slowly or fidgety/restless - - 0  Suicidal thoughts - - 0  PHQ-9 Score - - 3  Difficult doing work/chores - - Not difficult at all   Review of Systems  Constitutional: Negative for fever.  Musculoskeletal: Positive for back pain. Negative for neck pain.  Neurological: Negative for weakness.  All other systems reviewed and are negative.     Objective:   Physical Exam HENT: Normocephalic, Atraumatic Eyes: EOMI. No discharge.  Cardio: RRR. No JVD. Pulm: B/l clear to auscultation.  Effort normal  Abd: Soft, BS+ MSK:  Gait WNL.   TTP along lumbar PSP.    No edema.   +FABERs right.  Neg Fortin's finger Neuro:   Sensation intact to light touch in all LE dermatomes  Reflexes 2+ throughout  Strength  5/5 in all LE myotomes Skin: Warm and Dry. Intact. Psych: Normal mood and affect    Assessment & Plan:  41 year old male with past medical of DDD, surgical history of low back pain presents for evaluation.  1. Chronic mechanical low back pain  MRI report reviewed 07/2014 showing degenerative disc and facet disease L5-S1  Has tried ESIs, Discogram, facet injections, and is not a surgical candidate  Cont heat and TENs  Mobic ineffective  Unable to tolerate tramadol  Pt does not currently require pain meds  Encouraged pt to use back brace for strenuous activity  Encouraged pt to cont ergonomic adjustments at work, potential use of standing desk in addition to sitting  Encouraged stretching during work  Encouraged pool therapy   Robaxin 500 BID PRN ordered  Will order diclofenac 50 BID  Will consider referral to PT  Will consider facet injections in future  Will trial trigger point injections again  2. Morbid obesity  Encouraged weight loss again  Encouraged activity  Will refer to dietitian  3. Right sacroiliitis  Will consider referral for injection  4. Myalgias  Will consider trigger point injections in future

## 2016-10-22 ENCOUNTER — Encounter (HOSPITAL_BASED_OUTPATIENT_CLINIC_OR_DEPARTMENT_OTHER): Payer: BLUE CROSS/BLUE SHIELD | Admitting: Physical Medicine & Rehabilitation

## 2016-10-22 ENCOUNTER — Encounter: Payer: Self-pay | Admitting: Physical Medicine & Rehabilitation

## 2016-10-22 VITALS — BP 116/77 | HR 91 | Resp 14

## 2016-10-22 DIAGNOSIS — M791 Myalgia, unspecified site: Secondary | ICD-10-CM

## 2016-10-22 DIAGNOSIS — M461 Sacroiliitis, not elsewhere classified: Secondary | ICD-10-CM | POA: Diagnosis not present

## 2016-10-22 DIAGNOSIS — Z87891 Personal history of nicotine dependence: Secondary | ICD-10-CM | POA: Diagnosis not present

## 2016-10-22 DIAGNOSIS — M545 Low back pain: Secondary | ICD-10-CM | POA: Diagnosis not present

## 2016-10-22 NOTE — Progress Notes (Signed)
Trigger point injection procedure note: Trigger Point Injection: Written consent was obtained for the patient. Trigger points were identified of bilateral lumbar paraspinals. The areas were cleaned with alcohol, and each of  these trigger points were sprayed with vapocoolant and injected with 1 cc of a 0.5% Marcaine (x4). Needle draw back was performed. There were no complications from the procedure, and it was well tolerated.

## 2016-10-23 ENCOUNTER — Telehealth: Payer: Self-pay | Admitting: Gastroenterology

## 2016-10-23 NOTE — Telephone Encounter (Signed)
FGard samples out front for patient

## 2016-10-29 ENCOUNTER — Encounter (HOSPITAL_BASED_OUTPATIENT_CLINIC_OR_DEPARTMENT_OTHER): Payer: BLUE CROSS/BLUE SHIELD | Admitting: Physical Medicine & Rehabilitation

## 2016-10-29 ENCOUNTER — Encounter: Payer: Self-pay | Admitting: Physical Medicine & Rehabilitation

## 2016-10-29 VITALS — BP 124/82 | HR 81

## 2016-10-29 DIAGNOSIS — M545 Low back pain: Secondary | ICD-10-CM | POA: Diagnosis not present

## 2016-10-29 DIAGNOSIS — M461 Sacroiliitis, not elsewhere classified: Secondary | ICD-10-CM | POA: Diagnosis not present

## 2016-10-29 DIAGNOSIS — Z87891 Personal history of nicotine dependence: Secondary | ICD-10-CM | POA: Diagnosis not present

## 2016-10-29 DIAGNOSIS — M791 Myalgia, unspecified site: Secondary | ICD-10-CM

## 2016-10-29 NOTE — Progress Notes (Deleted)
Subjective:    Patient ID: James Webster, male    DOB: 09-17-1975, 41 y.o.   MRN: [id number]  HPI  41 year old male with past medical of DDD, surgical history of low back pain presents for follow up.  Initially stated; It started in summer 2013.  He denies inciting events.  He had some trauma soon after that exacerbated the symptoms.  The pain is progressively worse since 2015.  Heat, TENs, and rest improve the pain.  Prolonged activity and postures exacerbate the pain.  He describes the pain as achy, but can be sharp.  He has ?radiation to his posterior thigh.  Denies associated weakness, numbness, tingling.  Pain is constant. He does not like taking medications, but has taken flexeril, hydrocodone, tramadol at times, which helps.  He now has a sedentary job.  He has had ESIs in the past, but did not receive benefit. He had an MRI which showed mild to moderate degeneration at L5-S1. He is not a surgical candidate. He also physical therapy in the past without significant benefit. He also had a discogram, which had non-specific findings per pt. He saw a pain doctor, but did not feel comfortable, so never went back.   The pain inhibits pt from doing activities that he enjoys in life.   Last clinic visit 12/26/15.  Since last visit, he continues to use heat and TENS unit.  He does not remember going to PT.  He tore his MCL since last visit and has been focusing on that.  Pt tried pool therapy with benefit. Uses back brace with increased activity. Ergonomic adjustment have been made at work.  Pt states he has gained a lot of weight since last visit.  His pain has increased 1-2 months, R>L.   Pain Inventory Average Pain 8 Pain Right Now 8 My pain is sharp, stabbing and aching  In the last 24 hours, has pain interfered with the following? General activity 7 Relation with others 5 Enjoyment of life 8 What TIME of day is your pain at its worst? morning and evening Sleep (in general) Poor  Pain is  worse with: walking, bending, sitting and standing Pain improves with: rest, heat/ice, medication and TENS Relief from Meds: 7  Mobility walk without assistance ability to climb steps?  yes do you drive?  yes  Function employed # of hrs/week 40 what is your job? [employer]  Neuro/Psych numbness tingling  Prior Studies Any changes since last visit?  no  Physicians involved in your care Any changes since last visit?  no   Family History  Problem Relation Age of Onset  . Arthritis Mother   . Gallbladder disease Mother   . Healthy Father   . Gallbladder disease Sister   . Heart disease Maternal Grandfather   . Colon cancer Neg Hx   . Stomach cancer Neg Hx   . Rectal cancer Neg Hx   . Esophageal cancer Neg Hx   . Liver cancer Neg Hx    Social History   Social History  . Marital status: Married    Spouse name: N/A  . Number of children: 0  . Years of education: college   Occupational History  . [employer]    Social History Main Topics  . Smoking status: Former Smoker    Packs/day: 1.00    Years: 3.00    Types: Cigarettes    Quit date: 05/08/1996  . Smokeless tobacco: Never Used  . Alcohol use 0.0 oz/week  Comment: moderate  . Drug use: No  . Sexual activity: Not on file   Other Topics Concern  . Not on file   Social History Narrative   Drinks about 1/2 pot of coffee a day    Past Surgical History:  Procedure Laterality Date  . none     Past Medical History:  Diagnosis Date  . DDD (degenerative disc disease), lumbosacral 11/08/2015   There were no vitals taken for this visit.  Opioid Risk Score:   Fall Risk Score:  `1  Depression screen PHQ 2/9  Depression screen [facility] 2/9 12/26/2015 12/19/2015 11/08/2015  Decreased Interest 0 0 1  Down, Depressed, Hopeless 0 0 0  PHQ - 2 Score 0 0 1  Altered sleeping - - 1  Tired, decreased energy - - 1  Change in appetite - - 0  Feeling bad or failure about yourself  - - 0  Trouble concentrating  - - 0  Moving slowly or fidgety/restless - - 0  Suicidal thoughts - - 0  PHQ-9 Score - - 3  Difficult doing work/chores - - Not difficult at all   Review of Systems  Musculoskeletal: Positive for back pain. Negative for neck pain.  All other systems reviewed and are negative.     Objective:   Physical Exam HENT: Normocephalic, Atraumatic Eyes: EOMI. No discharge.  Cardio: RRR. No JVD. Pulm: B/l clear to auscultation.  Effort normal  Abd: Soft, BS+ MSK:  Gait WNL.   TTP along lumbar PSP.    No edema.   +FABERs right.  Neg Fortin's finger Neuro:   Sensation intact to light touch in all LE dermatomes  Reflexes 2+ throughout  Strength  5/5 in all LE myotomes Skin: Warm and Dry. Intact. Psych: Normal mood and affect    Assessment & Plan:  41 year old male with past medical of DDD, surgical history of low back pain presents for evaluation.  1. Chronic mechanical low back pain  MRI report reviewed 07/2014 showing degenerative disc and facet disease L5-S1  Has tried ESIs, Discogram, facet injections, and is not a surgical candidate  Cont heat and TENs  Mobic ineffective  Unable to tolerate tramadol  Pt does not currently require pain meds  Encouraged pt to use back brace for strenuous activity  Encouraged pt to cont ergonomic adjustments at work, potential use of standing desk in addition to sitting  Encouraged stretching during work  Encouraged pool therapy   Robaxin 500 BID PRN ordered  Will order diclofenac 50 BID  Will consider referral to PT  Will consider facet injections in future  Will trial trigger point injections again  2. Morbid obesity  Encouraged weight loss again  Encouraged activity  Will refer to dietitian  3. Right sacroiliitis  Will consider referral for injection  4. Myalgias  Will consider trigger point injections in future

## 2016-10-29 NOTE — Progress Notes (Signed)
Trigger point injection procedure note: Trigger Point Injection: Written consent was obtained for the patient. Trigger points were identified of right lumbosacral paraspinals. The areas were cleaned with alcohol, and each of  these trigger points were sprayed with vapocoolant and injected with 1 cc of a 0.5% Marcaine (x4). Needle draw back was performed. There were no complications from the procedure, and it was well tolerated.

## 2016-10-30 ENCOUNTER — Ambulatory Visit: Payer: BLUE CROSS/BLUE SHIELD | Admitting: Gastroenterology

## 2016-11-05 ENCOUNTER — Encounter: Payer: BLUE CROSS/BLUE SHIELD | Attending: Physical Medicine & Rehabilitation | Admitting: Registered"

## 2016-11-05 ENCOUNTER — Encounter: Payer: Self-pay | Admitting: Registered"

## 2016-11-05 DIAGNOSIS — E669 Obesity, unspecified: Secondary | ICD-10-CM

## 2016-11-05 DIAGNOSIS — Z713 Dietary counseling and surveillance: Secondary | ICD-10-CM | POA: Diagnosis not present

## 2016-11-05 NOTE — Patient Instructions (Addendum)
Make sure to get in 3 meals per day to provide adequate energy and promote healthy metabolism.   Try to get in fruits and vegetables at each meal.   Make sure to get breakfast in to start the day and include carbohydrates and protein.   Practice mindful eating-sit down at table without distractions for meals. Listen to the body's hunger and satiety cues with eating.   Try to get in physical activity.   Calorieking.[email or web address] resource for eating healthy at restaurants, etc.

## 2016-11-05 NOTE — Progress Notes (Signed)
  Medical Nutrition Therapy:  Appt start time: 0940 end time:  4627.   Assessment:  Primary concerns today: Pt referred by MD for obesity. Pt says he used to be in good shape and eat healthy, but that changed when he started a sedentary job. Pt says he cannot get weight loss started.   Preferred Learning Style:  No preference indicated   Learning Readiness:  Ready  MEDICATIONS: See list.    DIETARY INTAKE:  Usual eating pattern includes 2 meals and sometimes ~1 snacks per day.  Everyday foods include sandwiches, chicken, meat.  Avoided foods include mushrooms and onions.    Pt says 3 out of 5 days he works he only eats one meal per day.   Pt says he has purchased some [employer] meal replacement shakes to help keep from skipping meals. Pt says he sometimes cooks oatmeal on weekends and eats it throughout the week for breakfast. Pt says he thinks he should take in ~1200 kcal per day to promote weight loss. Pt says he often eats fast and while doing other things.   24-hr recall:  B ( AM):  None reported Snk ( AM): None reported L ( PM): Grilled chicken sandwich Snk ( PM): None reported D ( PM): Hamburger with tater tots  Snk ( PM): Handful of thin Ritz  Beverages: water, sometimes sparkling water with a little juice added for flavor.  Usual physical activity: a little bit of walking and golf. Little PA due to back pain.   Progress Towards Goal(s):  In progress.   Nutritional Diagnosis:  NI-5.11.1 Predicted suboptimal nutrient intake As related to pt skipping meals and limited variety in diet.  As evidenced by pt reported dietary recall.    Intervention:  Nutrition Counseling provided. Dietitian counseled pt on importance of getting 3 meals per day and the effect of skipping meals on metabolism and energy levels. Dietitian discussed how restrictive eating/dieting is not healthy and does not promote sustainable weight loss and advised pt that 1200 kcal/day is not enough to provide  adequate nutrient needs for his body. Dietitian discussed importance of practicing mindful eating and listening to the body's hunger cues. Dietitian counseled pt on the importance of having a balanced diet and regular meals.   Goals:   -Make sure to get in 3 meals per day to provide adequate energy and promote healthy metabolism.   -Try to get in fruits and vegetables at each meal.   -Make sure to get breakfast in to start the day and include carbohydrates and protein.   -Practice mindful eating-sit down at table without distractions for meals. Listen to the body's hunger and satiety cues with eating.   -Try to get in physical activity.   -Calorieking.com resource for eating healthy at restaurants, etc.   Teaching Method Utilized: Auditory  Handouts given during visit include:  My Plate food guide handout   Barriers to learning/adherence to lifestyle change: None indicated   Demonstrated degree of understanding via:  Teach Back   Monitoring/Evaluation:  Dietary intake, exercise, and body weight in 1 month(s).

## 2016-11-07 ENCOUNTER — Ambulatory Visit (INDEPENDENT_AMBULATORY_CARE_PROVIDER_SITE_OTHER): Payer: BLUE CROSS/BLUE SHIELD | Admitting: Neurology

## 2016-11-07 DIAGNOSIS — G4733 Obstructive sleep apnea (adult) (pediatric): Secondary | ICD-10-CM

## 2016-11-10 NOTE — Addendum Note (Signed)
Addended by: Star Age on: 11/10/2016 06:08 PM   Modules accepted: Orders

## 2016-11-10 NOTE — Progress Notes (Signed)
Patient referred by Dr. Ardeth Perfect, seen by me on 09/09/16, diagnostic PSG on 10/05/16, CPAP study on 11/07/16:  Please call and inform patient that I have entered an order for treatment with positive airway pressure (PAP) treatment of obstructive sleep apnea (OSA). He did well during the latest sleep study with CPAP. We will, therefore, arrange for a machine for home use through a DME (durable medical equipment) company of His choice; and I will see the patient back in follow-up in about 10 weeks. Please also explain to the patient that I will be looking out for compliance data, which can be downloaded from the machine (stored on an SD card, that is inserted in the machine) or via remote access through a modem, that is built into the machine. At the time of the followup appointment we will discuss sleep study results and how it is going with PAP treatment at home. Please advise patient to bring His machine at the time of the first FU visit, even though this is cumbersome. Bringing the machine for every visit after that will likely not be needed, but often helps for the first visit to troubleshoot if needed. Please re-enforce the importance of compliance with treatment and the need for [place] to monitor compliance data - often an insurance requirement and actually good feedback for the patient as far as how they are doing.  Also remind patient, that any interim PAP machine or mask issues should be first addressed with the DME company, as they can often help better with technical and mask fit issues. Please ask if patient has a preference regarding DME company.  Please also make sure, the patient has a follow-up appointment with me in about 10 weeks from the setup date, thanks.  Once you have spoken to the patient - and faxed/routed report to PCP and referring MD (if other than PCP), you can close this encounter, thanks,   Star Age, MD, PhD [facility] ([place])

## 2016-11-10 NOTE — Procedures (Signed)
PATIENT'S NAME:  James Webster, James Webster DOB:      08-27-1975      MR#:    [id number]     DATE OF RECORDING: 11/07/2016 REFERRING M.D.:  Velna Hatchet, MD Study Performed:   CPAP  Titration HISTORY: 41 year old man with a history of degenerative disc disease, abdominal pain, chronic constipation, and morbid obesity, who returns for a full night CPAP titration to treat his OSA. The patient endorsed the Epworth Sleepiness Scale at 9 points. The patient's weight 295 pounds with a height of 70 (inches), resulting in a BMI of 42.8 kg/m2. The patient's neck circumference measured 19 inches. His baseline sleep study from 10/05/16 showed moderate to severe obstructive sleep apnea, with a total AHI of 22.7/hour, REM AHI of 66/hour, and O2 nadir of 79%.   CURRENT MEDICATIONS: Flexeril   PROCEDURE:  This is a multichannel digital polysomnogram utilizing the SomnoStar 11.2 system.  Electrodes and sensors were applied and monitored per AASM Specifications.   EEG, EOG, Chin and Limb EMG, were sampled at 200 Hz.  ECG, Snore and Nasal Pressure, Thermal Airflow, Respiratory Effort, CPAP Flow and Pressure, Oximetry was sampled at 50 Hz. Digital video and audio were recorded.      The patient was fitted with a medium Simplus full face mask, which he preferred. CPAP was initiated at 5 cmH20 with heated humidity per AASM standards and pressure was advanced to 13 cmH20 because of  hypopneas, apneas and desaturations.  At a PAP pressure of 13 cmH20, there was a reduction of the AHI to 0/hour with supine non-REM sleep achieved and O2 nadir of 91%. Non-supine REM sleep was achieved on a pressure of 12 cm with AHI of 2.5/hour and O2 nadir of 89%.   Lights Out was at 23:23 and Lights On at 05:25. Total recording time (TRT) was 362.5 minutes, with a total sleep time (TST) of 244 minutes. The patient's sleep latency was 79.5 minutes, which is markedly prolonged. REM latency was 67.5 minutes, which is borderline reduced. The sleep  efficiency was 67.3%, which is reduced.    SLEEP ARCHITECTURE: WASO (Wake after sleep onset)  was 40.5 minutes with mild to moderate sleep fragmentation noted.  There were 44 minutes in Stage N1, 123.5 minutes Stage N2, 33.5 minutes Stage N3 and 43 minutes in Stage REM.  The percentage of Stage N1 was 18.%, which is increased, Stage N2 was 50.6%, which is normal, Stage N3 was 13.7% and Stage R (REM sleep) was 17.6%, which is near normal.  The arousals were noted as: 4 were spontaneous, 0 were associated with PLMs, 9 were associated with respiratory events.  Audio and video analysis did not show any abnormal or unusual movements, behaviors, phonations or vocalizations.  The patient took no bathroom breaks. The EKG was in keeping with normal sinus rhythm (NSR).  RESPIRATORY ANALYSIS:  There was a total of 15 respiratory events: 0 obstructive apneas, 0 central apneas and 0 mixed apneas with a total of 0 apneas and an apnea index (AI) of 0 /hour. There were 15 hypopneas with a hypopnea index of 3.7/hour. The patient also had 0 respiratory event related arousals (RERAs).      The total APNEA/HYPOPNEA INDEX  (AHI) was 3.7 /hour and the total RESPIRATORY DISTURBANCE INDEX was 3.7 .hour  6 events occurred in REM sleep and 9 events in NREM. The REM AHI was 8.4 /hour versus a non-REM AHI of 2.7 /hour.  The patient spent 170.5 minutes of total sleep time in the  supine position and 74 minutes in non-supine. The supine AHI was 4.2, versus a non-supine AHI of 2.4.  OXYGEN SATURATION & C02:  The baseline 02 saturation was 97%, with the lowest being 89%. Time spent below 89% saturation equaled 0 minutes.  PERIODIC LIMB MOVEMENTS: The patient had a total of 0 Periodic Limb Movements. The Periodic Limb Movement (PLM) index was 0 and the PLM Arousal index was 0 /hour.  Post-study, the patient indicated that sleep was worse than usual.  DIAGNOSIS:   1. Obstructive Sleep Apnea   PLANS/RECOMMENDATIONS:  1. This  study demonstrates resolution of the patient's obstructive sleep apnea with CPAP therapy. I will, therefore, start the patient on home CPAP treatment at a pressure of 13 cm via medium full face mask with heated humidity. The patient should be reminded to be fully compliant with PAP therapy to improve sleep related symptoms and decrease long term cardiovascular risks. The patient should be reminded, that it may take up to 3 months to get fully used to using PAP with all planned sleep. The earlier full compliance is achieved, the better long term compliance tends to be. Please note that untreated obstructive sleep apnea carries additional perioperative morbidity. Patients with significant obstructive sleep apnea should receive perioperative PAP therapy and the surgeons and particularly the anesthesiologist should be informed of the diagnosis and the severity of the sleep disordered breathing. 2. The patient should be cautioned not to drive, work at heights, or operate dangerous or heavy equipment when tired or sleepy. Review and reiteration of good sleep hygiene measures should be pursued with any patient. 3. The patient will be seen in follow-up by Dr. Rexene Alberts at [facility] for discussion of the test results and further management strategies. The referring provider will be notified of the test results.  I certify that I have reviewed the entire raw data recording prior to the issuance of this report in accordance with the Standards of Accreditation of the American Academy of Sleep Medicine (AASM)   Star Age, MD, PhD Diplomat, American Board of Psychiatry and Neurology (Neurology and Sleep Medicine)

## 2016-11-12 ENCOUNTER — Telehealth: Payer: Self-pay

## 2016-11-12 NOTE — Telephone Encounter (Signed)
LM for patient to call back for results

## 2016-11-12 NOTE — Telephone Encounter (Signed)
-----   Message from Star Age, MD sent at 11/10/2016  6:08 PM EDT ----- Patient referred by Dr. Ardeth Perfect, seen by me on 09/09/16, diagnostic PSG on 10/05/16, CPAP study on 11/07/16:  Please call and inform patient that I have entered an order for treatment with positive airway pressure (PAP) treatment of obstructive sleep apnea (OSA). He did well during the latest sleep study with CPAP. We will, therefore, arrange for a machine for home use through a DME (durable medical equipment) company of His choice; and I will see the patient back in follow-up in about 10 weeks. Please also explain to the patient that I will be looking out for compliance data, which can be downloaded from the machine (stored on an SD card, that is inserted in the machine) or via remote access through a modem, that is built into the machine. At the time of the followup appointment we will discuss sleep study results and how it is going with PAP treatment at home. Please advise patient to bring His machine at the time of the first FU visit, even though this is cumbersome. Bringing the machine for every visit after that will likely not be needed, but often helps for the first visit to troubleshoot if needed. Please re-enforce the importance of compliance with treatment and the need for [place] to monitor compliance data - often an insurance requirement and actually good feedback for the patient as far as how they are doing.  Also remind patient, that any interim PAP machine or mask issues should be first addressed with the DME company, as they can often help better with technical and mask fit issues. Please ask if patient has a preference regarding DME company.  Please also make sure, the patient has a follow-up appointment with me in about 10 weeks from the setup date, thanks.  Once you have spoken to the patient - and faxed/routed report to PCP and referring MD (if other than PCP), you can close this encounter, thanks,   Star Age, MD,  PhD [facility] ([place])

## 2016-11-12 NOTE — Telephone Encounter (Signed)
Patient called back. He is aware of results and recommendations. He is willing to start treatment. I will send orders to [facility]. Patient was able to make f/u appt.

## 2016-11-14 ENCOUNTER — Encounter: Payer: Self-pay | Admitting: Physical Medicine & Rehabilitation

## 2016-11-14 ENCOUNTER — Encounter
Payer: BLUE CROSS/BLUE SHIELD | Attending: Physical Medicine & Rehabilitation | Admitting: Physical Medicine & Rehabilitation

## 2016-11-14 VITALS — BP 112/78 | HR 76

## 2016-11-14 DIAGNOSIS — G4733 Obstructive sleep apnea (adult) (pediatric): Secondary | ICD-10-CM

## 2016-11-14 DIAGNOSIS — M5137 Other intervertebral disc degeneration, lumbosacral region: Secondary | ICD-10-CM

## 2016-11-14 DIAGNOSIS — G8929 Other chronic pain: Secondary | ICD-10-CM

## 2016-11-14 DIAGNOSIS — G894 Chronic pain syndrome: Secondary | ICD-10-CM

## 2016-11-14 DIAGNOSIS — M791 Myalgia, unspecified site: Secondary | ICD-10-CM

## 2016-11-14 DIAGNOSIS — M545 Low back pain, unspecified: Secondary | ICD-10-CM

## 2016-11-14 DIAGNOSIS — M461 Sacroiliitis, not elsewhere classified: Secondary | ICD-10-CM | POA: Insufficient documentation

## 2016-11-14 DIAGNOSIS — M4696 Unspecified inflammatory spondylopathy, lumbar region: Secondary | ICD-10-CM

## 2016-11-14 DIAGNOSIS — Z87891 Personal history of nicotine dependence: Secondary | ICD-10-CM | POA: Insufficient documentation

## 2016-11-14 DIAGNOSIS — G479 Sleep disorder, unspecified: Secondary | ICD-10-CM

## 2016-11-14 DIAGNOSIS — M47816 Spondylosis without myelopathy or radiculopathy, lumbar region: Secondary | ICD-10-CM

## 2016-11-14 MED ORDER — METHOCARBAMOL 500 MG PO TABS: 1000.0000 mg | ORAL_TABLET | Freq: Three times a day (TID) | ORAL | 1 refills | Status: AC | PRN

## 2016-11-14 MED ORDER — DICLOFENAC SODIUM 50 MG PO TBEC: 50.0000 mg | DELAYED_RELEASE_TABLET | Freq: Two times a day (BID) | ORAL | 1 refills | Status: AC

## 2016-11-14 NOTE — Progress Notes (Signed)
Subjective:    Patient ID: James Webster, male    DOB: Dec 10, 1975, 41 y.o.   MRN: [id number]  HPI  42 year old male with past medical of DDD, surgical history of low back pain presents for follow up.  Initially stated; It started in summer 2013.  He denies inciting events.  He had some trauma soon after that exacerbated the symptoms.  The pain is progressively worse since 2015.  Heat, TENs, and rest improve the pain.  Prolonged activity and postures exacerbate the pain.  He describes the pain as achy, but can be sharp.  He has ?radiation to his posterior thigh.  Denies associated weakness, numbness, tingling.  Pain is constant. He does not like taking medications, but has taken flexeril, hydrocodone, tramadol at times, which helps.  He now has a sedentary job.  He has had ESIs in the past, but did not receive benefit. He had an MRI which showed mild to moderate degeneration at L5-S1. He is not a surgical candidate. He also physical therapy in the past without significant benefit. He also had a discogram, which had non-specific findings per pt. He saw a pain doctor, but did not feel comfortable, so never went back.   The pain inhibits pt from doing activities that he enjoys in life.   Last clinic visit 10/29/16.  He received 2 sets of trigger point injections. He states they only worked for 1 day.  He states he was lifting heavy boxes and furniture yesterday and states he aggravated his back.  He did not use his back brace either. He is stretching at work.  He has went to pool therapy yet.  Robaxin appears to have some benefit. He notes benefit with Diclofenac.  TENS helps.  He saw dietitian and states he lost 10 pounds.    Pain Inventory Average Pain 7 Pain Right Now 7 My pain is constant  In the last 24 hours, has pain interfered with the following? General activity 9 Relation with others 8 Enjoyment of life 8 What TIME of day is your pain at its worst? morning and evening Sleep (in  general) Poor  Pain is worse with: walking, bending, sitting and standing Pain improves with: rest, medication and TENS Relief from Meds: 4  Mobility walk without assistance Do you have any goals in this area?  no  Function employed # of hrs/week 40 Do you have any goals in this area?  no  Neuro/Psych No problems in this area  Prior Studies Any changes since last visit?  no  Physicians involved in your care Any changes since last visit?  no   Family History  Problem Relation Age of Onset  . Arthritis Mother   . Gallbladder disease Mother   . Healthy Father   . Gallbladder disease Sister   . Heart disease Maternal Grandfather   . Cancer Maternal Aunt   . Colon cancer Neg Hx   . Stomach cancer Neg Hx   . Rectal cancer Neg Hx   . Esophageal cancer Neg Hx   . Liver cancer Neg Hx    Social History   Social History  . Marital status: Married    Spouse name: N/A  . Number of children: 0  . Years of education: college   Occupational History  . [employer]    Social History Main Topics  . Smoking status: Former Smoker    Packs/day: 1.00    Years: 3.00    Types: Cigarettes    Quit  date: 05/08/1996  . Smokeless tobacco: Never Used  . Alcohol use 0.0 oz/week     Comment: moderate  . Drug use: No  . Sexual activity: Not Asked   Other Topics Concern  . None   Social History Narrative   Drinks about 1/2 pot of coffee a day    Past Surgical History:  Procedure Laterality Date  . none     Past Medical History:  Diagnosis Date  . DDD (degenerative disc disease), lumbosacral 11/08/2015   BP 112/78   Pulse 76   SpO2 95%   Opioid Risk Score:   Fall Risk Score:  `1  Depression screen PHQ 2/9  Depression screen [facility] 2/9 11/05/2016 12/26/2015 12/19/2015 11/08/2015  Decreased Interest 0 0 0 1  Down, Depressed, Hopeless 0 0 0 0  PHQ - 2 Score 0 0 0 1  Altered sleeping - - - 1  Tired, decreased energy - - - 1  Change in appetite - - - 0  Feeling bad or  failure about yourself  - - - 0  Trouble concentrating - - - 0  Moving slowly or fidgety/restless - - - 0  Suicidal thoughts - - - 0  PHQ-9 Score - - - 3  Difficult doing work/chores - - - Not difficult at all   Review of Systems  Constitutional: Negative.  Negative for fever.  HENT: Negative.   Eyes: Negative.   Respiratory: Negative.   Cardiovascular: Negative.   Gastrointestinal: Negative.   Endocrine: Negative.   Genitourinary: Negative.   Musculoskeletal: Positive for back pain. Negative for neck pain.  Skin: Negative.   Allergic/Immunologic: Negative.   Neurological: Negative.  Negative for weakness.  Hematological: Negative.   Psychiatric/Behavioral: Negative.   All other systems reviewed and are negative.     Objective:   Physical Exam HENT: Normocephalic, Atraumatic Eyes: EOMI. No discharge.  Cardio: RRR. No JVD. Pulm: B/l clear to auscultation.  Effort normal  Abd: Soft, BS+ MSK:  Gait WNL.   TTP along lumbar PSP and gluteal muscles.    No edema.   +FABERs right.  Neg Fortin's finger Neuro:   Sensation intact to light touch in all LE dermatomes  Strength  5/5 in all LE myotomes Skin: Warm and Dry. Intact. Psych: Normal mood and affect    Assessment & Plan:  41 year old male with past medical of DDD, surgical history of low back pain presents with pain..  1. Chronic mechanical low back pain  MRI report reviewed 07/2014 showing degenerative disc and facet disease L5-S1  Has tried ESIs, Discogram, facet injections, and is not a surgical candidate  Cont heat and TENs  Mobic ineffective  Unable to tolerate tramadol  Encouraged pt to use back brace for strenuous activity  Encouraged pt to cont ergonomic adjustments at work, potential use of standing desk in addition to sitting  Encouraged stretching during work  Encouraged pool therapy again  Robaxin increased to 1000 TID PRN   Cont diclofenac 50 BID  With recent flare  Will consider SCS in future  2.  Morbid obesity  Encouraged weight loss   Encouraged activity  Cont dietitian  3. Right sacroiliitis  Will consider referral for injection in future  4. Myalgias  See #1  Trigger points ineffective  5. Sleep disturbance with OSA  See #1  To have CPAP soon  >40 minutes spent with patient with >35 minutes discussion activity modification, precautions, and SCS

## 2016-11-25 NOTE — Telephone Encounter (Addendum)
Pt called said he has not heard from DME company. I gave him [facility], he was driving and could not take the phone number, said he would look it up at a later time. FYI

## 2016-11-25 NOTE — Telephone Encounter (Signed)
Sent message to [facility] to check on status.

## 2016-11-26 ENCOUNTER — Ambulatory Visit: Payer: BLUE CROSS/BLUE SHIELD | Admitting: Gastroenterology

## 2016-11-26 ENCOUNTER — Telehealth: Payer: Self-pay

## 2016-11-26 NOTE — Telephone Encounter (Signed)
Unfortunately, I cannot write him a work note without evaluation. Thanks

## 2016-11-26 NOTE — Telephone Encounter (Signed)
Patient called, stated had a fall yesterday and is now in more pain due to fall, has requested a work note stating that due to fall was out of work yesterday and today.

## 2016-11-26 NOTE — Telephone Encounter (Signed)
Called patient back, no answer, left message to call us back.

## 2016-11-27 NOTE — Telephone Encounter (Signed)
Called patient back today and explained that doctor cannot legally make the note without seeing him for the injury.

## 2016-12-03 NOTE — Telephone Encounter (Signed)
I sent Nira Conn another message to f/u on this.

## 2016-12-03 NOTE — Telephone Encounter (Signed)
Message I received from [place]:   Good morning James Webster, [place] [birth year] is scheduled for set up 12/10/2016.

## 2016-12-10 ENCOUNTER — Ambulatory Visit: Payer: BLUE CROSS/BLUE SHIELD | Admitting: Registered"

## 2016-12-10 DIAGNOSIS — G4733 Obstructive sleep apnea (adult) (pediatric): Secondary | ICD-10-CM | POA: Diagnosis not present

## 2016-12-15 DIAGNOSIS — Z0000 Encounter for general adult medical examination without abnormal findings: Secondary | ICD-10-CM | POA: Diagnosis not present

## 2016-12-22 ENCOUNTER — Encounter: Payer: Self-pay | Admitting: Neurology

## 2016-12-23 ENCOUNTER — Telehealth: Payer: Self-pay | Admitting: Neurology

## 2016-12-23 DIAGNOSIS — G4733 Obstructive sleep apnea (adult) (pediatric): Secondary | ICD-10-CM

## 2016-12-23 NOTE — Telephone Encounter (Addendum)
Pt said pressure is too high on CPAP- it is blowing like a "tornado". He was advised by [facility] to call to get pressure turned down by provider. Pt is aware Dr Rexene Alberts will be back on the office tomorrow

## 2016-12-23 NOTE — Telephone Encounter (Signed)
Pt returned my call. He reports that he feels the cpap pressure is too high. Pt says that the mask leaks air as well. I encouraged him to speak with [facility] regarding the mask leak. Pt is having difficulty adjusting to the cpap because of the pressure and is sleeping worse than before. Pt is wondering if the pressure can be reduced?

## 2016-12-23 NOTE — Telephone Encounter (Signed)
I called pt to discuss. No answer, left a message asking him to call me back. 

## 2016-12-23 NOTE — Telephone Encounter (Signed)
In the past 30 days, patient has been on CPAP for 10 days, 30% compliance only. AHI 1.2 per hour, leak on the high side with the 95th percentile at 26.2 L/m on a pressure of 13 cm with EPR. I will reduce his CPAP pressure to 11 cm. Please encourage patient to try to be compliant with CPAP.

## 2016-12-23 NOTE — Telephone Encounter (Signed)
I called pt to discuss. No answer, left a message asking him to call me back. I will send order to [facility].

## 2016-12-24 NOTE — Telephone Encounter (Signed)
I called pt and advised him that his cpap pressure was decreased to 11 cm H2O and the order was sent to [facility]. [facility] should be contacting pt soon regarding the pressure change. Pt verbalized understanding. Pt had no questions at this time but was encouraged to call back if questions arise.

## 2016-12-24 NOTE — Telephone Encounter (Signed)
Pt returned RN's call. He said to LVM and he will try to call RN back

## 2017-01-09 DIAGNOSIS — G4733 Obstructive sleep apnea (adult) (pediatric): Secondary | ICD-10-CM | POA: Diagnosis not present

## 2017-01-12 ENCOUNTER — Ambulatory Visit: Payer: BLUE CROSS/BLUE SHIELD | Admitting: Registered"

## 2017-01-14 ENCOUNTER — Encounter: Payer: BLUE CROSS/BLUE SHIELD | Admitting: Physical Medicine & Rehabilitation

## 2017-01-26 ENCOUNTER — Encounter: Payer: Self-pay | Admitting: Neurology

## 2017-01-26 ENCOUNTER — Ambulatory Visit (INDEPENDENT_AMBULATORY_CARE_PROVIDER_SITE_OTHER): Payer: BLUE CROSS/BLUE SHIELD | Admitting: Neurology

## 2017-01-26 ENCOUNTER — Encounter (INDEPENDENT_AMBULATORY_CARE_PROVIDER_SITE_OTHER): Payer: Self-pay

## 2017-01-26 VITALS — BP 148/87 | HR 91 | Temp 98.5°F | Ht 69.0 in | Wt 298.0 lb

## 2017-01-26 DIAGNOSIS — R05 Cough: Secondary | ICD-10-CM

## 2017-01-26 DIAGNOSIS — R509 Fever, unspecified: Secondary | ICD-10-CM | POA: Diagnosis not present

## 2017-01-26 DIAGNOSIS — G4733 Obstructive sleep apnea (adult) (pediatric): Secondary | ICD-10-CM | POA: Diagnosis not present

## 2017-01-26 DIAGNOSIS — Z6841 Body Mass Index (BMI) 40.0 and over, adult: Secondary | ICD-10-CM | POA: Diagnosis not present

## 2017-01-26 DIAGNOSIS — Z9989 Dependence on other enabling machines and devices: Secondary | ICD-10-CM

## 2017-01-26 DIAGNOSIS — R059 Cough, unspecified: Secondary | ICD-10-CM

## 2017-01-26 DIAGNOSIS — J0100 Acute maxillary sinusitis, unspecified: Secondary | ICD-10-CM | POA: Diagnosis not present

## 2017-01-26 DIAGNOSIS — J40 Bronchitis, not specified as acute or chronic: Secondary | ICD-10-CM | POA: Diagnosis not present

## 2017-01-26 NOTE — Progress Notes (Signed)
Subjective:    Patient ID: James Webster is a 41 y.o. male.  HPI     Interim history:   James Webster is a 41 year old right-handed gentleman with an underlying medical history of degenerative disc disease, abdominal pain, chronic constipation, and morbid obesity, who presents for follow-up consultation of his obstructive sleep apnea, after recent sleep study testing. The patient is unaccompanied today. I first met him on 09/09/2016 at the request of his primary care physician, at which time he reported snoring and excessive daytime somnolence as well as witnessed apneas. The patient was invited for sleep study testing. He had a baseline sleep study, followed by a CPAP titration study. I went over his test results with him in detail today. His baseline sleep study performed 10/05/2016 showed a sleep efficiency of 95.6%, sleep latency of 12 minutes, REM latency mildly prolonged at 150.5 minutes, he had an increased percentage of stage II sleep, slow-wave sleep was adequate at 14.6%, REM sleep was 20.3%. He had a total AHI of 22.7 per hour, REM AHI of 66 per hour, supine AHI was 22.7 per hour. His average oxygen saturation was 94%, nadir was 79%. He had no significant PLMS or EKG changes or EEG changes. Based on his test results I invited him for a full night CPAP titration study. He had this on 11/07/2016. Sleep efficiency was 67.3% only, sleep latency was 79.5 minutes  and REM latency was 67.5 minutes. He had an increased percentage of stage I sleep, slow-wave sleep was 13.7% and REM sleep was 17.6%. CPAP was titrated via full facemask which was patient preferred. CPAP was titrated from 5 cm to 13 cm. On a pressure of 12 cm his AHI was 12.5 per hour, O2 nadir of 89% with nonsupine REM sleep achieved. Based on his test results are prescribed CPAP therapy at 13 cm for home use.   He called in the interim in June 2018 requesting a pressure reduction as it felt too high for him. I reduced his CPAP  pressure to 11 cm.   Today, 01/26/2017 (all dictated new, as well as above notes, some dictation done in note pad or Word, outside of chart, may appear as copied):  I reviewed his CPAP compliance data from 12/23/2016 through 01/21/2017, which is a total of 30 days, during which time he used his CPAP 28 days with percent used days greater than 4 hours at 90%, indicating excellent compliance with an average usage of 6 hours and 16 minutes, residual AHI at 2.3 per hour, leak on the higher side with the 95th percentile at 21.2 L/m on a pressure of 11 cm with EPR of 3. He reports doing well with CPAP. He is still adjusting to daytime work hours, he started his daytime work hours on 01/04/2017. Unfortunately, over the past 2 days or so he has developed a cough and also low-grade fever.he could not use his CPAP last night because of coughing and having a fever. His temperature came down after he took Motrin. He is advised to call his primary care physician's office for [place] of being seen today, maybe with the nurse practitioner for a sick visit. He is advised that he may have to skip using his CPAP one or 2 more nights and hydrate well, try to keep the fever down. He does have several bouts of cough during the visit today and does appear to be sick. He feels that his sleep schedule will improve once he gets more used to his new  work schedule. His weight has gone up a little bit and he is aware of it. As far as sleeping with the CPAP, he reports that he can tolerate the 11 cm pressure much better than the original 13 cm. He used using a Respironics full face mask but because of his facial hair the mass does not always stay completely sealed. Overall, he feels improved with respect to his daytime energy, sleep quality, sleep consolidation and he is quite pleased with how he feels after starting CPAP therapy. He is motivated to continue with treatment.   The patient's allergies, current medications, family  history, past medical history, past social history, past surgical history and problem list were reviewed and updated as appropriate.   Previously (copied from previous notes for reference):   09/09/2016: (He) reports snoring, witnessed apneic breathing pauses while asleep and morning headaches as well as daytime somnolence. I reviewed your office note from 07/30/2016 as well as phone note from 08/14/2016. His Epworth sleepiness score is 9 out of 24, his fatigue score is 48 out of 63. He is married and lives with his wife, they have no children, 1 cat. They watch TV in bed and put the TV on a timer for 90 minutes or 120 minutes typically. 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 He works from 1 PM to 10 PM, bedtime is therefore late, between midnight and 3 AM typically. He does not have trouble going to sleep but has sleep disruption and wakes up with a sense of gasping for air, has witnessed apneas per wife, has occasional morning headaches, sometimes he has to take ibuprofen for his headaches, no history of migraines. He is not aware of any family history of OSA, denies restless leg symptoms or leg twitching at night, sometimes has woken up with palpitations. He has no night to night nocturia. He quit smoking in 1997, drinks alcohol about twice a week, drinks caffeine in the form of coffee, about 6 cups per day, has occasional vivid dreams but typically no nightmares. He moved to [place] in November 2016. 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 He has always been active and physically quite fit and athletic. He has been gaining weight since he has taken up a sedentary job. This is the highest weight he has ever been. He has a history of right knee injury which does limit his mobility. He also has low back pain, had MRI testing, saw orthopedics, has received epidural steroid injections and also saw pain management doctor. He takes Norco very  occasionally, has a prescription from 2016. He takes Flexeril occasionally as well. He has been a poor sleeper for years, he has been snoring loudly for years. He does not remember the last time he felt really well about his sleep.   His Past Medical History Is Significant For: Past Medical History:  Diagnosis Date  . DDD (degenerative disc disease), lumbosacral 11/08/2015    His Past Surgical History Is Significant For: Past Surgical History:  Procedure Laterality Date  . none      His Family History Is Significant For: Family History  Problem Relation Age of Onset  . Arthritis Mother   . Gallbladder disease Mother   . Healthy Father   . Gallbladder disease Sister   . Heart disease Maternal Grandfather   . Cancer Maternal Aunt   . Colon cancer Neg Hx   . Stomach cancer Neg  Hx   . Rectal cancer Neg Hx   . Esophageal cancer Neg Hx   . Liver cancer Neg Hx     His Social History Is Significant For: Social History   Social History  . Marital status: Married    Spouse name: N/A  . Number of children: 0  . Years of education: college   Occupational History  . [employer]    Social History Main Topics  . Smoking status: Former Smoker    Packs/day: 1.00    Years: 3.00    Types: Cigarettes    Quit date: 05/08/1996  . Smokeless tobacco: Never Used  . Alcohol use 0.0 oz/week     Comment: moderate  . Drug use: No  . Sexual activity: Not Asked   Other Topics Concern  . None   Social History Narrative   Drinks about 1/2 pot of coffee a day     His Allergies Are:  No Known Allergies:   His Current Medications Are:  Outpatient Encounter Prescriptions as of 01/26/2017  Medication Sig  . cetirizine (ZYRTEC) 10 MG tablet Take 10 mg by mouth daily.  . diclofenac (VOLTAREN) 50 MG EC tablet Take 50 mg by mouth 2 (two) times daily.  . Misc. Devices (NASAL SPRAY BOTTLE) MISC by Does not apply route.  . Multiple Vitamins-Minerals (MULTIVITAMIN ADULT PO) Take by mouth.    No facility-administered encounter medications on file as of 01/26/2017.   :  Review of Systems:  Out of a complete 14 point review of systems, all are reviewed and negative with the exception of these symptoms as listed below: Review of Systems  Neurological:       Pt presents today to discuss his cpap. Pt feels better after using his cpap. Pt thinks he has bronchitis today; he had a fever of 101 this morning.    Objective:  Neurological Exam  Physical Exam Physical Examination:   Vitals:   01/26/17 0935  BP: (!) 148/87  Pulse: 91  Temp: 98.5 F (36.9 C)   General Examination: The patient is a very pleasant 41 y.o. male in no acute distress, skin very mildly clammy, coughing, appears unwell. He appears well-developed and well-nourished and well groomed.   HEENT: Normocephalic, atraumatic, pupils are equal, round and reactive to light and accommodation. Extraocular tracking is good without limitation to gaze excursion or nystagmus noted. Normal smooth pursuit is noted. Hearing is grossly intact. Face is symmetric with normal facial animation and normal facial sensation. Slightly flushed appearing. Airway mildly irritated, intermittent cough, speech slight raspy. There is no hypophonia. There is no lip, neck/head, jaw or voice tremor. Neck is supple with full range of passive and active motion.   Chest: Clear to auscultation without wheezing, rhonchi or crackles noted.  Heart: S1+S2+0, regular and normal without murmurs, rubs or gallops noted.   Abdomen: Soft, non-tender and non-distended with normal bowel sounds appreciated on auscultation.  Extremities: There is no pitting edema in the distal lower extremities bilaterally. Pedal pulses are intact.  Skin: Warm and dry without trophic changes noted. There are no varicose veins.  Musculoskeletal: exam reveals no obvious changes.    Neurologically:  Mental status: The patient is awake, alert and oriented in all 4  spheres. His immediate and remote memory, attention, language skills and fund of knowledge are appropriate. There is no evidence of aphasia, agnosia, apraxia or anomia. Thought process is linear. Mood is normal and affect is normal.  Cranial nerves II - XII  are as described above under HEENT exam. In addition: shoulder shrug is normal with equal shoulder height noted. Motor exam: Normal bulk, strength and tone is noted. There is no drift, tremor or rebound. Romberg is negative. Reflexes are 1+ throughout. Fine motor skills and coordination: grossly intact.  Cerebellar testing: No dysmetria or intention tremor. There is no truncal or gait ataxia.  Sensory exam: intact to light touch in the upper and lower extremities.  Gait, station and balance: He stands easily. No veering to one side is noted. No leaning to one side is noted. Posture is age-appropriate and stance is narrow based. Gait shows normal stride length and normal pace. No problems turning are noted. Tandem walk is unremarkable.   Assessment and Plan:  In summary, James Webster is a very pleasant 41 year old male with an underlying medical history of degenerative disc disease, abdominal pain, chronic constipation, R knee pain, and morbid obesity, whoPresents for follow-up consultation of his moderate to severe obstructive sleep apnea, after sleep study testing and starting CPAP therapy subsequently. He has done well. We talked about his baseline sleep study from April 2018 as well as his CPAP titration study from May 2018. He is currently on CPAP of 11 cm after he requested a pressure reduction in the interim and his AHI is at goal. He is compliant with treatment and indicates good results as well. In the past couple of days he has developed a cough and low-grade fever. He may develop a bronchitis, he is advised to talk to his primary care physician about being seen today or he may have to go to urgent care. He is advised that he may have to  skip a night or 2 longer with his CPAP as he is acutely sick. He is furthermore advised to drink plenty of water and try to keep the fever down. He is encouraged to try to make more time for sleep, ideally 7-8 hours on average. He is currently using CPAP for just a little over 6 hours and he indicates that once he gets used to the new work and sleep schedule he will do better. He is also reminded to work on weight loss. I suggested a six-month follow-up with one of our nurse practitioners for recheck and hopefully yearly thereafter. He is commended for his treatment adherence. I answered all his questions today and he was in agreement with the plan.  I spent 25 minutes in total face-to-face time with the patient, more than 50% of which was spent in counseling and coordination of care, reviewing test results, reviewing medication and discussing or reviewing the diagnosis of OSA, its prognosis and treatment options. Pertinent laboratory and imaging test results that were available during this visit with the patient were reviewed by me and considered in my medical decision making (see chart for details).

## 2017-01-26 NOTE — Patient Instructions (Addendum)
Please continue using your CPAP regularly. While your insurance requires that you use CPAP at least 4 hours each night on 70% of the nights, I recommend, that you not skip any nights and use it throughout the night if you can. Getting used to CPAP and staying with the treatment long term does take time and patience and discipline. Untreated obstructive sleep apnea when it is moderate to severe can have an adverse impact on cardiovascular health and raise her risk for heart disease, arrhythmias, hypertension, congestive heart failure, stroke and diabetes. Untreated obstructive sleep apnea causes sleep disruption, nonrestorative sleep, and sleep deprivation. This can have an impact on your day to day functioning and cause daytime sleepiness and impairment of cognitive function, memory loss, mood disturbance, and problems focussing. Using CPAP regularly can improve these symptoms.  Keep up the good work! We can see you in 6 months, you can see one of our nurse practitioners as you are stable. I will see you after that.   Please go to [facility] for concern for bronchitis, please call your PCP office first, they may be able to see you today. You will have you skip your CPAP for a night or 2 longer.

## 2017-02-09 DIAGNOSIS — G4733 Obstructive sleep apnea (adult) (pediatric): Secondary | ICD-10-CM | POA: Diagnosis not present

## 2017-02-17 DIAGNOSIS — M5489 Other dorsalgia: Secondary | ICD-10-CM | POA: Diagnosis not present

## 2017-02-17 DIAGNOSIS — Z1389 Encounter for screening for other disorder: Secondary | ICD-10-CM | POA: Diagnosis not present

## 2017-02-17 DIAGNOSIS — Z0000 Encounter for general adult medical examination without abnormal findings: Secondary | ICD-10-CM | POA: Diagnosis not present

## 2017-02-17 DIAGNOSIS — G4733 Obstructive sleep apnea (adult) (pediatric): Secondary | ICD-10-CM | POA: Diagnosis not present

## 2017-02-23 DIAGNOSIS — M79672 Pain in left foot: Secondary | ICD-10-CM | POA: Diagnosis not present

## 2017-03-11 ENCOUNTER — Other Ambulatory Visit: Payer: Self-pay

## 2017-03-11 ENCOUNTER — Telehealth: Payer: Self-pay

## 2017-03-11 MED ORDER — METHOCARBAMOL 500 MG PO TABS: 1000.0000 mg | ORAL_TABLET | Freq: Three times a day (TID) | ORAL | 1 refills | Status: DC | PRN

## 2017-03-11 NOTE — Telephone Encounter (Signed)
Patient called neededing information on his prescribed robaxin, called patient back to discuss the medicine with him and also to inform him of a missed appointment as well and to see if we can get him back on schedule, no answer, left message to call back

## 2017-03-12 DIAGNOSIS — G4733 Obstructive sleep apnea (adult) (pediatric): Secondary | ICD-10-CM | POA: Diagnosis not present

## 2017-03-13 ENCOUNTER — Encounter: Payer: Self-pay | Admitting: Physical Medicine & Rehabilitation

## 2017-03-13 ENCOUNTER — Encounter
Payer: BLUE CROSS/BLUE SHIELD | Attending: Physical Medicine & Rehabilitation | Admitting: Physical Medicine & Rehabilitation

## 2017-03-13 VITALS — BP 122/82 | HR 80 | Resp 14

## 2017-03-13 DIAGNOSIS — M5137 Other intervertebral disc degeneration, lumbosacral region: Secondary | ICD-10-CM | POA: Insufficient documentation

## 2017-03-13 DIAGNOSIS — M791 Myalgia, unspecified site: Secondary | ICD-10-CM

## 2017-03-13 DIAGNOSIS — G894 Chronic pain syndrome: Secondary | ICD-10-CM | POA: Diagnosis not present

## 2017-03-13 DIAGNOSIS — Z9889 Other specified postprocedural states: Secondary | ICD-10-CM | POA: Diagnosis not present

## 2017-03-13 DIAGNOSIS — G8929 Other chronic pain: Secondary | ICD-10-CM | POA: Diagnosis not present

## 2017-03-13 DIAGNOSIS — M461 Sacroiliitis, not elsewhere classified: Secondary | ICD-10-CM | POA: Insufficient documentation

## 2017-03-13 DIAGNOSIS — Z87891 Personal history of nicotine dependence: Secondary | ICD-10-CM | POA: Diagnosis not present

## 2017-03-13 DIAGNOSIS — G479 Sleep disorder, unspecified: Secondary | ICD-10-CM

## 2017-03-13 DIAGNOSIS — M545 Low back pain, unspecified: Secondary | ICD-10-CM

## 2017-03-13 DIAGNOSIS — G4733 Obstructive sleep apnea (adult) (pediatric): Secondary | ICD-10-CM

## 2017-03-13 DIAGNOSIS — M51379 Other intervertebral disc degeneration, lumbosacral region without mention of lumbar back pain or lower extremity pain: Secondary | ICD-10-CM

## 2017-03-13 DIAGNOSIS — M4696 Unspecified inflammatory spondylopathy, lumbar region: Secondary | ICD-10-CM

## 2017-03-13 DIAGNOSIS — M47816 Spondylosis without myelopathy or radiculopathy, lumbar region: Secondary | ICD-10-CM

## 2017-03-13 MED ORDER — DICLOFENAC SODIUM 1 % TD GEL: 2.0000 g | Freq: Four times a day (QID) | TRANSDERMAL | 2 refills | Status: DC

## 2017-03-13 NOTE — Progress Notes (Signed)
Subjective:    Patient ID: James Webster, male    DOB: 1976-02-01, 41 y.o.   MRN: [id number]  HPI  41 year old male with past medical of DDD, surgical history of low back pain presents for follow up.  Initially stated; It started in summer 2013.  He denies inciting events.  He had some trauma soon after that exacerbated the symptoms.  The pain is progressively worse since 2015.  Heat, TENs, and rest improve the pain.  Prolonged activity and postures exacerbate the pain.  He describes the pain as achy, but can be sharp.  He has ?radiation to his posterior thigh.  Denies associated weakness, numbness, tingling.  Pain is constant. He does not like taking medications, but has taken flexeril, hydrocodone, tramadol at times, which helps.  He now has a sedentary job.  He has had ESIs in the past, but did not receive benefit. He had an MRI which showed mild to moderate degeneration at L5-S1. He is not a surgical candidate. He also physical therapy in the past without significant benefit. He also had a discogram, which had non-specific findings per pt. He saw a pain doctor, but did not feel comfortable, so never went back.   The pain inhibits pt from doing activities that he enjoys in life.   Last clinic visit 11/14/16.  Since that time, he states he had a mechanical fall, but states it was not a big deal.  They have made accommodations at work.  He is more effective and has last 20 lbs.  He states he is doing exercises at home.  Taking Robaxin ~1/week. He does not want to continue to diclofenac.  He has obtained his CPAP and sleep has improved.   Pain Inventory Average Pain 7 Pain Right Now 7 My pain is sharp, stabbing and aching  In the last 24 hours, has pain interfered with the following? General activity 7 Relation with others 7 Enjoyment of life 7 What TIME of day is your pain at its worst? daytime and evening Sleep (in general) Good  Pain is worse with: walking, bending, sitting, inactivity  and standing Pain improves with: rest, heat/ice, medication and TENS Relief from Meds: 4  Mobility walk without assistance Do you have any goals in this area?  no  Function employed # of hrs/week 40 Do you have any goals in this area?  no  Neuro/Psych No problems in this area  Prior Studies Any changes since last visit?  no  Physicians involved in your care Any changes since last visit?  no   Family History  Problem Relation Age of Onset  . Arthritis Mother   . Gallbladder disease Mother   . Healthy Father   . Gallbladder disease Sister   . Heart disease Maternal Grandfather   . Cancer Maternal Aunt   . Colon cancer Neg Hx   . Stomach cancer Neg Hx   . Rectal cancer Neg Hx   . Esophageal cancer Neg Hx   . Liver cancer Neg Hx    Social History   Social History  . Marital status: Married    Spouse name: N/A  . Number of children: 0  . Years of education: college   Occupational History  . [employer]    Social History Main Topics  . Smoking status: Former Smoker    Packs/day: 1.00    Years: 3.00    Types: Cigarettes    Quit date: 05/08/1996  . Smokeless tobacco: Never Used  .  Alcohol use 0.0 oz/week     Comment: moderate  . Drug use: No  . Sexual activity: Not Asked   Other Topics Concern  . None   Social History Narrative   Drinks about 1/2 pot of coffee a day    Past Surgical History:  Procedure Laterality Date  . none     Past Medical History:  Diagnosis Date  . DDD (degenerative disc disease), lumbosacral 11/08/2015   BP 122/82 (BP Location: Right Arm, Patient Position: Sitting, Cuff Size: Large)   Pulse 80   Resp 14   SpO2 96%   Opioid Risk Score:   Fall Risk Score:  `1  Depression screen PHQ 2/9  Depression screen [facility] 2/9 11/05/2016 12/26/2015 12/19/2015 11/08/2015  Decreased Interest 0 0 0 1  Down, Depressed, Hopeless 0 0 0 0  PHQ - 2 Score 0 0 0 1  Altered sleeping - - - 1  Tired, decreased energy - - - 1  Change in appetite - -  - 0  Feeling bad or failure about yourself  - - - 0  Trouble concentrating - - - 0  Moving slowly or fidgety/restless - - - 0  Suicidal thoughts - - - 0  PHQ-9 Score - - - 3  Difficult doing work/chores - - - Not difficult at all   Review of Systems  Constitutional: Negative.  Negative for fever.  HENT: Negative.   Eyes: Negative.   Respiratory: Negative.   Cardiovascular: Negative.   Gastrointestinal: Negative.   Endocrine: Negative.   Genitourinary: Negative.   Musculoskeletal: Positive for back pain. Negative for neck pain.  Skin: Negative.   Allergic/Immunologic: Negative.   Neurological: Negative.  Negative for weakness.  Hematological: Negative.   Psychiatric/Behavioral: Negative.   All other systems reviewed and are negative.     Objective:   Physical Exam HENT: Normocephalic, Atraumatic Eyes: EOMI. No discharge.  Cardio: RRR. No JVD. Pulm: B/l clear to auscultation.  Effort normal Abd: Soft, BS+ MSK:  Gait WNL.   TTP along right lumbosacral PSP.    No edema.   Neg Fortin's finger Neuro:   Strength  5/5 in all LE myotomes Skin: Warm and Dry. Intact. Psych: Normal mood and affect    Assessment & Plan:  41 year old male with past medical of DDD, surgical history of low back pain presents for follow of low back pain.  1. Chronic mechanical low back pain             MRI report reviewed 07/2014 showing degenerative disc and facet disease L5-S1             Has tried ESIs, Discogram, facet injections, and is not a surgical candidate             Cont heat and TENs             Mobic ineffective             Diclofenac d/ced due to limited benefit              Unable to tolerate tramadol             Encouraged pt to use back brace for strenuous activity             Ergonomic adjustments made at work with standing desk in addition to sitting             Encouraged stretching during work  Cont aerobic exercise   Cont Robaxin 1000 BID PRN  Will order Voltaren gel              Will consider SCS in future  2. Morbid obesity  Cont weight loss              Encouraged activity             Cont dietitian  3. Right sacroiliitis: Not main problem at present             Will consider referral for injection in future  4. Myalgias             See #1             Trigger points ineffective  5. Sleep disturbance with OSA: Improving             See #1  Cont CPAP

## 2017-03-20 ENCOUNTER — Telehealth: Payer: Self-pay

## 2017-03-20 ENCOUNTER — Other Ambulatory Visit: Payer: Self-pay

## 2017-03-20 NOTE — Telephone Encounter (Signed)
Begun a PA for this patient to have voltaren gel medication for lower back pain, it recieved a disapproval on the PA for :  Your Voltaren Gel criteria covers this drug when you have osteoarthritis pain in your joints such as the feet, ankles, knees, hands, wrist, or elbow. Your use of this drug does not meet the requirement. This is based on the information we have.  Please advise

## 2017-03-20 NOTE — Telephone Encounter (Signed)
We can try lidocaine ointment.  Thanks.

## 2017-03-20 NOTE — Telephone Encounter (Signed)
How is it to be written, dosage, application, amount?

## 2017-03-20 NOTE — Telephone Encounter (Signed)
Lidocaine 5% ointment. May apply as needed.  Thanks.

## 2017-03-23 MED ORDER — LIDOCAINE 5 % EX OINT: 1.0000 "application " | TOPICAL_OINTMENT | CUTANEOUS | 0 refills | Status: DC | PRN

## 2017-03-23 NOTE — Addendum Note (Signed)
Addended by: Geryl Rankins D on: 03/23/2017 04:08 PM   Modules accepted: Orders

## 2017-03-23 NOTE — Telephone Encounter (Signed)
Ordered

## 2017-03-27 ENCOUNTER — Other Ambulatory Visit: Payer: Self-pay | Admitting: *Deleted

## 2017-03-27 ENCOUNTER — Telehealth: Payer: Self-pay | Admitting: *Deleted

## 2017-03-27 NOTE — Telephone Encounter (Signed)
Prior Authorization for Diclofenac Sodium Gel DENIED. Clinical indication is osteoarthritis of the hands, wrist, elbows, or knees.

## 2017-03-27 NOTE — Telephone Encounter (Signed)
Error

## 2017-04-21 DIAGNOSIS — G4733 Obstructive sleep apnea (adult) (pediatric): Secondary | ICD-10-CM | POA: Diagnosis not present

## 2017-06-18 DIAGNOSIS — G4733 Obstructive sleep apnea (adult) (pediatric): Secondary | ICD-10-CM | POA: Diagnosis not present

## 2017-07-15 ENCOUNTER — Encounter
Payer: BLUE CROSS/BLUE SHIELD | Attending: Physical Medicine & Rehabilitation | Admitting: Physical Medicine & Rehabilitation

## 2017-07-22 DIAGNOSIS — Z6841 Body Mass Index (BMI) 40.0 and over, adult: Secondary | ICD-10-CM | POA: Diagnosis not present

## 2017-07-23 DIAGNOSIS — G4733 Obstructive sleep apnea (adult) (pediatric): Secondary | ICD-10-CM | POA: Diagnosis not present

## 2017-07-29 ENCOUNTER — Ambulatory Visit: Payer: BLUE CROSS/BLUE SHIELD | Admitting: Adult Health

## 2017-08-03 ENCOUNTER — Encounter (INDEPENDENT_AMBULATORY_CARE_PROVIDER_SITE_OTHER): Payer: Self-pay

## 2017-08-03 ENCOUNTER — Ambulatory Visit: Payer: BLUE CROSS/BLUE SHIELD | Admitting: Adult Health

## 2017-08-03 ENCOUNTER — Encounter: Payer: Self-pay | Admitting: Adult Health

## 2017-08-03 VITALS — BP 134/80 | HR 93 | Wt 271.6 lb

## 2017-08-03 DIAGNOSIS — Z9989 Dependence on other enabling machines and devices: Secondary | ICD-10-CM | POA: Diagnosis not present

## 2017-08-03 DIAGNOSIS — G4733 Obstructive sleep apnea (adult) (pediatric): Secondary | ICD-10-CM | POA: Diagnosis not present

## 2017-08-03 NOTE — Progress Notes (Addendum)
PATIENT: James Webster DOB: [birth year]  REASON FOR VISIT: follow up HISTORY FROM: patient  HISTORY OF PRESENT ILLNESS: Mr. Bache is a 42 year old male with a history of obstructive sleep apnea on CPAP.  He returns today for follow-up.  His CPAP download indicates that he use his machine 56 out of 90 days for compliance of 62%.  On average he uses machine greater than 4 hours 41 out of 90 days for compliance of 46%.  This indicates suboptimal compliance.  On average he uses his machine 4 hours and 35 minutes.  His residual AHI is 1.2 on 11 cm of water with EPR 3.  His leak in the 95th percentile is 51.5 L/min.  He states that he is having a hard time with his mask.  He states that often times it is leaking and eventually he has to take it off.  He states initially the CPAP works well for him and he noticed the benefit.  He reports now the CPAP is actually keeping him awake.  He reports that he has lost approximately 25 pounds since the last visit.  He returns today for evaluation.  HISTORY  01/26/2017  I reviewed his CPAP compliance data from 12/23/2016 through 01/21/2017, which is a total of 30 days, during which time he used his CPAP 28 days with percent used days greater than 4 hours at 90%, indicating excellent compliance with an average usage of 6 hours and 16 minutes, residual AHI at 2.3 per hour, leak on the higher side with the 95th percentile at 21.2 L/m on a pressure of 11 cm with EPR of 3. He reports doing well with CPAP. He is still adjusting to daytime work hours, he started his daytime work hours on 01/04/2017. Unfortunately, over the past 2 days or so he has developed a cough and also low-grade fever.he could not use his CPAP last night because of coughing and having a fever. His temperature came down after he took Motrin. He is advised to call his primary care physician's office for [place] of being seen today, maybe with the nurse practitioner for a sick visit. He is  advised that he may have to skip using his CPAP one or 2 more nights and hydrate well, try to keep the fever down. He does have several bouts of cough during the visit today and does appear to be sick. He feels that his sleep schedule will improve once he gets more used to his new work schedule. His weight has gone up a little bit and he is aware of it. As far as sleeping with the CPAP, he reports that he can tolerate the 11 cm pressure much better than the original 13 cm. He used using a Respironics full face mask but because of his facial hair the mass does not always stay completely sealed. Overall, he feels improved with respect to his daytime energy, sleep quality, sleep consolidation and he is quite pleased with how he feels after starting CPAP therapy. He is motivated to continue with treatment.  REVIEW OF SYSTEMS: Out of a complete 14 system review of symptoms, the patient complains only of the following symptoms, and all other reviewed systems are negative.  Back pain, insomnia, apnea  ALLERGIES: No Known Allergies  HOME MEDICATIONS: Outpatient Medications Prior to Visit  Medication Sig Dispense Refill  . cetirizine (ZYRTEC) 10 MG tablet Take 10 mg by mouth daily.    . methocarbamol (ROBAXIN) 500 MG tablet Take 2 tablets (1,000 mg total)  by mouth every 8 (eight) hours as needed for muscle spasms. 180 tablet 1  . Misc. Devices (NASAL SPRAY BOTTLE) MISC by Does not apply route.    . Multiple Vitamins-Minerals (MULTIVITAMIN ADULT PO) Take by mouth.    Marland Kitchen SAXENDA 18 MG/3ML SOPN daily.  11  . lidocaine (XYLOCAINE) 5 % ointment Apply 1 application topically as needed. (Patient not taking: Reported on 08/03/2017) 35.44 g 0   No facility-administered medications prior to visit.     PAST MEDICAL HISTORY: Past Medical History:  Diagnosis Date  . DDD (degenerative disc disease), lumbosacral 11/08/2015  . OSA on CPAP     PAST SURGICAL HISTORY: Past Surgical History:  Procedure Laterality Date   . none      FAMILY HISTORY: Family History  Problem Relation Age of Onset  . Arthritis Mother   . Gallbladder disease Mother   . Healthy Father   . Gallbladder disease Sister   . Heart disease Maternal Grandfather   . Cancer Maternal Aunt   . Colon cancer Neg Hx   . Stomach cancer Neg Hx   . Rectal cancer Neg Hx   . Esophageal cancer Neg Hx   . Liver cancer Neg Hx     SOCIAL HISTORY: Social History   Socioeconomic History  . Marital status: Married    Spouse name: Not on file  . Number of children: 0  . Years of education: college  . Highest education level: Not on file  Social Needs  . Financial resource strain: Not on file  . Food insecurity - worry: Not on file  . Food insecurity - inability: Not on file  . Transportation needs - medical: Not on file  . Transportation needs - non-medical: Not on file  Occupational History  . Occupation: Environmental health practitioner  Tobacco Use  . Smoking status: Former Smoker    Packs/day: 1.00    Years: 3.00    Pack years: 3.00    Types: Cigarettes    Last attempt to quit: 05/08/1996    Years since quitting: 21.2  . Smokeless tobacco: Never Used  Substance and Sexual Activity  . Alcohol use: Yes    Alcohol/week: 0.0 oz    Comment: moderate  . Drug use: No  . Sexual activity: Not on file  Other Topics Concern  . Not on file  Social History Narrative   Drinks about 1/2 pot of coffee a day       PHYSICAL EXAM  Vitals:   08/03/17 1054  BP: 134/80  Pulse: 93  Weight: 271 lb 9.6 oz (123.2 kg)   Body mass index is 40.11 kg/m.  Generalized: Well developed, in no acute distress   Neurological examination  Mentation: Alert oriented to time, place, history taking. Follows all commands speech and language fluent Cranial nerve II-XII: Pupils were equal round reactive to light. Extraocular movements were full, visual field were full on confrontational test. Facial sensation and strength were normal. Uvula tongue midline. Head  turning and shoulder shrug  were normal and symmetric.  Neck circumference 17-1/2 inches Motor: The motor testing reveals 5 over 5 strength of all 4 extremities. Good symmetric motor tone is noted throughout.  Sensory: Sensory testing is intact to soft touch on all 4 extremities. No evidence of extinction is noted.  Coordination: Cerebellar testing reveals good finger-nose-finger and heel-to-shin bilaterally.  Gait and station: Gait is normal.    DIAGNOSTIC DATA (LABS, IMAGING, TESTING) - I reviewed patient records, labs, notes, testing and imaging  myself where available.    ASSESSMENT AND PLAN 42 y.o. year old male  has a past medical history of DDD (degenerative disc disease), lumbosacral (11/08/2015) and OSA on CPAP. here with :  1.  Obstructive sleep apnea on CPAP  The patient CPAP download indicates suboptimal compliance.  He does have a high leak.  He will have his mask refitted today in our sleep lab.  He is advised that if the new mask is not beneficial he should let us know.  He will follow-up in 6 months or sooner if needed.  I spent 15 minutes with the patient. 50% of this time was spent reviewing CPAP download      Ward Givens, MSN, NP-C 08/03/2017, 10:58 AM [facility] [address] [place], [place] [address] [phone]  I reviewed the above note and documentation by the Nurse Practitioner and agree with the history, physical exam, assessment and plan as outlined above. I was immediately available for face-to-face consultation. Star Age, MD, PhD [facility])

## 2017-08-03 NOTE — Patient Instructions (Signed)
Your Plan:  Continue using cpap nightly Mask refitting today If your symptoms worsen or you develop new symptoms please let us know.   Thank you for coming to see [place] at [facility]. I hope we have been able to provide you high quality care today.  You may receive a patient satisfaction survey over the next few weeks. We would appreciate your feedback and comments so that we may continue to improve ourselves and the health of our patients.

## 2017-08-18 ENCOUNTER — Telehealth: Payer: Self-pay | Admitting: Adult Health

## 2017-08-18 DIAGNOSIS — G4733 Obstructive sleep apnea (adult) (pediatric): Secondary | ICD-10-CM

## 2017-08-18 DIAGNOSIS — Z9989 Dependence on other enabling machines and devices: Principal | ICD-10-CM

## 2017-08-18 NOTE — Telephone Encounter (Signed)
Patient calling to advise he would like an order faxed to [facility] for Dreamwear nasal pillow mask.

## 2017-08-18 NOTE — Addendum Note (Signed)
Addended by: Brandon Melnick on: 08/18/2017 09:12 AM   Modules accepted: Orders

## 2017-08-18 NOTE — Telephone Encounter (Signed)
Fax confirmation received to [facility] [phone].

## 2017-08-18 NOTE — Telephone Encounter (Signed)
Order placed

## 2017-09-18 DIAGNOSIS — G4733 Obstructive sleep apnea (adult) (pediatric): Secondary | ICD-10-CM | POA: Diagnosis not present

## 2017-09-25 DIAGNOSIS — Z3141 Encounter for fertility testing: Secondary | ICD-10-CM | POA: Diagnosis not present

## 2017-09-29 ENCOUNTER — Encounter: Payer: Self-pay | Admitting: Adult Health

## 2017-11-18 DIAGNOSIS — G4733 Obstructive sleep apnea (adult) (pediatric): Secondary | ICD-10-CM | POA: Diagnosis not present

## 2018-01-26 ENCOUNTER — Telehealth: Payer: Self-pay | Admitting: Adult Health

## 2018-01-26 DIAGNOSIS — G4733 Obstructive sleep apnea (adult) (pediatric): Secondary | ICD-10-CM

## 2018-01-26 DIAGNOSIS — Z9989 Dependence on other enabling machines and devices: Principal | ICD-10-CM

## 2018-01-26 NOTE — Telephone Encounter (Signed)
Pt is requesting his CPAP be set to auto due to too much air coming out and making him uncomfortable.  Please call

## 2018-01-28 NOTE — Addendum Note (Signed)
Addended by: Trudie Buckler on: 01/28/2018 08:52 AM   Modules accepted: Orders

## 2018-01-28 NOTE — Telephone Encounter (Signed)
Faxed new orders to [facility] at [phone]. Received fax confirmation.

## 2018-01-28 NOTE — Telephone Encounter (Signed)
Orders placed.

## 2018-01-28 NOTE — Telephone Encounter (Signed)
Called and spoke with pt. Advised new orders sent to [facility]. Pt verbalized understanding and appreciation for call.

## 2018-02-02 ENCOUNTER — Ambulatory Visit: Payer: BLUE CROSS/BLUE SHIELD | Admitting: Adult Health

## 2018-02-17 DIAGNOSIS — Z125 Encounter for screening for malignant neoplasm of prostate: Secondary | ICD-10-CM | POA: Diagnosis not present

## 2018-02-17 DIAGNOSIS — Z0000 Encounter for general adult medical examination without abnormal findings: Secondary | ICD-10-CM | POA: Diagnosis not present

## 2018-02-24 DIAGNOSIS — Z1389 Encounter for screening for other disorder: Secondary | ICD-10-CM | POA: Diagnosis not present

## 2018-02-24 DIAGNOSIS — Z0000 Encounter for general adult medical examination without abnormal findings: Secondary | ICD-10-CM | POA: Diagnosis not present

## 2018-02-24 DIAGNOSIS — M5489 Other dorsalgia: Secondary | ICD-10-CM | POA: Diagnosis not present

## 2018-02-24 DIAGNOSIS — Z6835 Body mass index (BMI) 35.0-35.9, adult: Secondary | ICD-10-CM | POA: Diagnosis not present

## 2018-03-04 DIAGNOSIS — Z6835 Body mass index (BMI) 35.0-35.9, adult: Secondary | ICD-10-CM | POA: Diagnosis not present

## 2018-03-04 DIAGNOSIS — L658 Other specified nonscarring hair loss: Secondary | ICD-10-CM | POA: Diagnosis not present

## 2018-03-19 DIAGNOSIS — L650 Telogen effluvium: Secondary | ICD-10-CM | POA: Diagnosis not present

## 2018-03-19 DIAGNOSIS — L258 Unspecified contact dermatitis due to other agents: Secondary | ICD-10-CM | POA: Diagnosis not present

## 2018-03-28 ENCOUNTER — Encounter: Payer: Self-pay | Admitting: Adult Health

## 2018-03-29 DIAGNOSIS — G4733 Obstructive sleep apnea (adult) (pediatric): Secondary | ICD-10-CM | POA: Diagnosis not present

## 2018-03-30 ENCOUNTER — Ambulatory Visit: Payer: BLUE CROSS/BLUE SHIELD | Admitting: Adult Health

## 2018-03-30 ENCOUNTER — Encounter: Payer: Self-pay | Admitting: Adult Health

## 2018-03-30 VITALS — BP 110/61 | HR 71 | Ht 69.0 in | Wt 236.2 lb

## 2018-03-30 DIAGNOSIS — G4733 Obstructive sleep apnea (adult) (pediatric): Secondary | ICD-10-CM

## 2018-03-30 DIAGNOSIS — Z9989 Dependence on other enabling machines and devices: Secondary | ICD-10-CM

## 2018-03-30 NOTE — Progress Notes (Addendum)
PATIENT: James Webster DOB: December 22, 1975  REASON FOR VISIT: follow up HISTORY FROM: patient  HISTORY OF PRESENT ILLNESS: Today 03/30/18:  James Webster is a 42 year old male with a history of obstructive sleep apnea on CPAP.  He returns today for follow-up.  His CPAP download indicates that he uses machine 23 out of 56 days for compliance of 41%.  He uses machine greater than 4 hours 30 days for compliance to 23%.  He reports that he is now working 1 PM to 10 PM.  He states that he typically goes to the gym after work and does not go to bed till 1 or 2 AM.  He states occasionally if his wife has to get up early he will sleep in another bedroom and unfortunately is unable to use his CPAP.  His average usage is 4 hours and 47 minutes.  He is on a pressure of 5 to 15 cm of water with EPR of 3.  His residual AHI is 1.5.  He does not have a significant leak.  The patient weighed 295 when he had a sleep study. He now weighs 236.  He returns today for evaluation.  HISTORY James Webster is a 42 year old male with a history of obstructive sleep apnea on CPAP.  He returns today for follow-up.  His CPAP download indicates that he use his machine 56 out of 90 days for compliance of 62%.  On average he uses machine greater than 4 hours 41 out of 90 days for compliance of 46%.  This indicates suboptimal compliance.  On average he uses his machine 4 hours and 35 minutes.  His residual AHI is 1.2 on 11 cm of water with EPR 3.  His leak in the 95th percentile is 51.5 L/min.  He states that he is having a hard time with his mask.  He states that often times it is leaking and eventually he has to take it off.  He states initially the CPAP works well for him and he noticed the benefit.  He reports now the CPAP is actually keeping him awake.  He reports that he has lost approximately 25 pounds since the last visit.  He returns today for evaluation.  REVIEW OF SYSTEMS: Out of a complete 14 system review of symptoms,  the patient complains only of the following symptoms, and all other reviewed systems are negative.  Epworth sleepiness score 5  ALLERGIES: No Known Allergies  HOME MEDICATIONS: Outpatient Medications Prior to Visit  Medication Sig Dispense Refill  . cetirizine (ZYRTEC) 10 MG tablet Take 10 mg by mouth daily.    Marland Kitchen lidocaine (XYLOCAINE) 5 % ointment Apply 1 application topically as needed. 35.44 g 0  . methocarbamol (ROBAXIN) 500 MG tablet Take 2 tablets (1,000 mg total) by mouth every 8 (eight) hours as needed for muscle spasms. 180 tablet 1  . Misc. Devices (NASAL SPRAY BOTTLE) MISC by Does not apply route.    . Multiple Vitamins-Minerals (MULTIVITAMIN ADULT PO) Take by mouth.    Marland Kitchen SAXENDA 18 MG/3ML SOPN daily.  11   No facility-administered medications prior to visit.     PAST MEDICAL HISTORY: Past Medical History:  Diagnosis Date  . DDD (degenerative disc disease), lumbosacral 11/08/2015  . OSA on CPAP     PAST SURGICAL HISTORY: Past Surgical History:  Procedure Laterality Date  . none      FAMILY HISTORY: Family History  Problem Relation Age of Onset  . Arthritis Mother   . Gallbladder disease  Mother   . Healthy Father   . Gallbladder disease Sister   . Heart disease Maternal Grandfather   . Cancer Maternal Aunt   . Colon cancer Neg Hx   . Stomach cancer Neg Hx   . Rectal cancer Neg Hx   . Esophageal cancer Neg Hx   . Liver cancer Neg Hx     SOCIAL HISTORY: Social History   Socioeconomic History  . Marital status: Married    Spouse name: Not on file  . Number of children: 0  . Years of education: college  . Highest education level: Not on file  Occupational History  . 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[employer]  . Financial resource strain: Not on file  . Food insecurity:    Worry: Not on file    Inability: Not on file  . Transportation needs:    Medical: Not on file    Non-medical: Not on file  Tobacco Use  . Smoking status: Former Smoker     Packs/day: 1.00    Years: 3.00    Pack years: 3.00    Types: Cigarettes    Last attempt to quit: 05/08/1996    Years since quitting: 21.9  . Smokeless tobacco: Never Used  Substance and Sexual Activity  . Alcohol use: Yes    Alcohol/week: 0.0 standard drinks    Comment: moderate  . Drug use: No  . Sexual activity: Not on file  Lifestyle  . Physical activity:    Days per week: Not on file    Minutes per session: Not on file  . Stress: Not on file  Relationships  . Social connections:    Talks on phone: Not on file    Gets together: Not on file    Attends religious service: Not on file    Active member of club or organization: Not on file    Attends meetings of clubs or organizations: Not on file    Relationship status: Not on file  . Intimate partner violence:    Fear of current or ex partner: Not on file    Emotionally abused: Not on file    Physically abused: Not on file    Forced sexual activity: Not on file  Other Topics Concern  . Not on file  Social History Narrative   Drinks about 1/2 pot of coffee a day       PHYSICAL EXAM  Vitals:   03/30/18 0737  BP: 110/61  Pulse: 71  Weight: 236 lb 3.2 oz (107.1 kg)  Height: 5\' 9"  (1.753 m)   Body mass index is 34.88 kg/m.  Generalized: Well developed, in no acute distress   Neurological examination  Mentation: Alert oriented to time, place, history taking. Follows all commands speech and language fluent Cranial nerve II-XII: Pupils were equal round reactive to light. Extraocular movements were full, visual field were full on confrontational test. Facial sensation and strength were normal. Uvula tongue midline. Head turning and shoulder shrug  were normal and symmetric. Motor: The motor testing reveals 5 over 5 strength of all 4 extremities. Good symmetric motor tone is noted throughout.  Sensory: Sensory testing is intact to soft touch on all 4 extremities. No evidence of extinction is noted.  Coordination:  Cerebellar testing reveals good finger-nose-finger and heel-to-shin bilaterally.  Gait and station: Gait is normal. Reflexes: Deep tendon reflexes are symmetric and normal bilaterally.   DIAGNOSTIC DATA (LABS, IMAGING, TESTING) - I reviewed patient records, labs, notes, testing and imaging myself  where available.     ASSESSMENT AND PLAN 42 y.o. year old male  has a past medical history of DDD (degenerative disc disease), lumbosacral (11/08/2015) and OSA on CPAP. here with:  1.  Obstructive sleep apnea on CPAP  The patient is encouraged to continue using CPAP nightly and greater than 4 hours each night.  Patient has lost approximately 60 pounds since his sleep study.  We will repeat a home sleep test to confirm if he still has sleep apnea.  Patient is amenable to this plan.  He will follow-up in 6 months or sooner if needed.   I spent 15 minutes with the patient. 50% of this time was spent reviewing his CPAP download   Ward Givens, MSN, NP-C 03/30/2018, 7:47 AM [facility] [address] [place], [place] [address] [phone]  I reviewed the above note and documentation by the Nurse Practitioner and agree with the history, physical exam, assessment and plan as outlined above. I was immediately available for face-to-face consultation. Star Age, MD, PhD [facility])

## 2018-03-30 NOTE — Patient Instructions (Signed)
Your Plan:  Continue using cpap nightly and >4 hours each night Will repeat sleep study If your symptoms worsen or you develop new symptoms please let us know.   Thank you for coming to see [place] at [facility]. I hope we have been able to provide you high quality care today.  You may receive a patient satisfaction survey over the next few weeks. We would appreciate your feedback and comments so that we may continue to improve ourselves and the health of our patients.

## 2018-05-19 ENCOUNTER — Ambulatory Visit (INDEPENDENT_AMBULATORY_CARE_PROVIDER_SITE_OTHER): Payer: BLUE CROSS/BLUE SHIELD | Admitting: Neurology

## 2018-05-19 DIAGNOSIS — Z9989 Dependence on other enabling machines and devices: Secondary | ICD-10-CM

## 2018-05-19 DIAGNOSIS — G4733 Obstructive sleep apnea (adult) (pediatric): Secondary | ICD-10-CM | POA: Diagnosis not present

## 2018-05-26 ENCOUNTER — Telehealth: Payer: Self-pay

## 2018-05-26 NOTE — Procedures (Signed)
[facility] [address] [place], [place] [address] NAME: James Webster                                                               DOB: 13-Dec-1975 MEDICAL RECORD no:   [id number]                                              DOS:  05/20/2018 REFERRING PHYSICIAN: Ward Givens, NP STUDY PERFORMED: Home Sleep Test on [facility] HISTORY: 42 year old male with a history of obstructive sleep apnea on CPAP.  He returns for re-evaluation of his OSA due to significant weight loss achieved. BMI of 34.9.  STUDY RESULTS:  Total Recording Time: 7 h, 15 min,  Total sleep Time: 6 h, 50 min Total Apnea/Hypopnea Index (AHI):  15.1/h, RDI: 18.0/h, REM AHI: 24.5/h Average Oxygen Saturation:   95%, Lowest Oxygen Desaturation: 88%  Total Time Oxygen Saturation Below or at 88%: 0.1 minutes  Average Heart Rate: 79 bpm (between 60 and 109 bpm) IMPRESSION: OSA RECOMMENDATION: This home sleep test demonstrates residual obstructive sleep apnea, in the moderate range (by number of events). I would recommend ongoing treatment with autoPAP. I would recommend reducing the pressure range and narrowing the pressure differences, as well as a mask refit for better tolerance. Please note that untreated obstructive sleep apnea may carry additional perioperative morbidity. Patients with significant obstructive sleep apnea should receive perioperative PAP therapy and the surgeons and particularly the anesthesiologist should be informed of the diagnosis and the severity of the sleep disordered breathing. The patient should be cautioned not to drive, work at heights, or operate dangerous or heavy equipment when tired or sleepy. Review and reiteration of good sleep hygiene measures should be pursued with any patient. Other causes of the patient's symptoms, including circadian rhythm disturbances, an underlying mood disorder, medication effect and/or an underlying medical problem cannot be ruled out  based on this test. Clinical correlation is recommended. The patient and his referring provider will be notified of the test results. The patient will be seen in follow up in sleep clinic at [facility].  I certify that I have reviewed the raw data recording prior to the issuance of this report in accordance with the standards of the American Academy of Sleep Medicine (AASM).   Star Age, MD, PhD [facility]) Diplomat, ABPN (Neurology and Sleep)

## 2018-05-26 NOTE — Telephone Encounter (Signed)
I called pt and discussed his sleep study results. Pt is agreeable to the mask refit and the pressure reduction. Pt will check with [facility] to find out if he needs a 90 day follow up or if he can keep his appt for May. Pt verbalized understanding of results. Pt had no questions at this time but was encouraged to call back if questions arise. Order for pressure change and mask refit sent to [facility] via community message. Confirmation received that the order transmitted was successful.

## 2018-05-26 NOTE — Telephone Encounter (Signed)
-----   Message from Star Age, MD sent at 05/26/2018  8:34 AM EST ----- Pt last seen by MM. HST for re-eval of OSA post weight loss (test on 05/20/18): His home sleep test demonstrates residual obstructive sleep apnea, in the moderate range (by number of events). I would recommend ongoing treatment with autoPAP. I would recommend reducing the pressure range and narrowing the pressure differences, as well as a mask refit for better tolerance. Pls advise pt and send order to DME; he may need FU within 90 days, can see NP or me. Michel Bickers

## 2018-05-26 NOTE — Addendum Note (Signed)
Addended by: Star Age on: 05/26/2018 08:34 AM   Modules accepted: Orders

## 2018-05-26 NOTE — Progress Notes (Signed)
Pt last seen by MM. HST for re-eval of OSA post weight loss (test on 05/20/18): His home sleep test demonstrates residual obstructive sleep apnea, in the moderate range (by number of events). I would recommend ongoing treatment with autoPAP. I would recommend reducing the pressure range and narrowing the pressure differences, as well as a mask refit for better tolerance. Pls advise pt and send order to DME; he may need FU within 90 days, can see NP or me. Michel Bickers

## 2018-07-14 DIAGNOSIS — R509 Fever, unspecified: Secondary | ICD-10-CM | POA: Diagnosis not present

## 2018-07-14 DIAGNOSIS — J020 Streptococcal pharyngitis: Secondary | ICD-10-CM | POA: Diagnosis not present

## 2018-07-14 DIAGNOSIS — G4733 Obstructive sleep apnea (adult) (pediatric): Secondary | ICD-10-CM | POA: Diagnosis not present

## 2018-07-14 DIAGNOSIS — R05 Cough: Secondary | ICD-10-CM | POA: Diagnosis not present

## 2018-08-02 ENCOUNTER — Telehealth: Payer: Self-pay | Admitting: Adult Health

## 2018-08-02 NOTE — Telephone Encounter (Signed)
Pt called stating that the nose mask is irritating his nose and he is needing a face mask. Please advise.

## 2018-08-02 NOTE — Telephone Encounter (Signed)
Called dropped but pt called back and also stated that he is not able to sleep well because of the nose mask so there for he is not able to use the machine. Please advise.

## 2018-08-03 NOTE — Telephone Encounter (Signed)
Called patient and informed ihm that Dr Rexene Alberts ordered refit for mask and all new supplies on 05/26/18, and the order is good for one year. He stated he called [facility] "a while back" and was told he wasn't eligible yet. I advised he call them again and ask them to look at order on 05/26/18. Advised he call back if any further issues. Patient verbalized understanding, appreciation.

## 2018-08-09 DIAGNOSIS — G4733 Obstructive sleep apnea (adult) (pediatric): Secondary | ICD-10-CM | POA: Diagnosis not present

## 2018-09-13 DIAGNOSIS — R42 Dizziness and giddiness: Secondary | ICD-10-CM | POA: Diagnosis not present

## 2018-11-03 DIAGNOSIS — M5489 Other dorsalgia: Secondary | ICD-10-CM | POA: Diagnosis not present

## 2018-11-03 DIAGNOSIS — M5380 Other specified dorsopathies, site unspecified: Secondary | ICD-10-CM | POA: Diagnosis not present

## 2018-11-03 DIAGNOSIS — G4733 Obstructive sleep apnea (adult) (pediatric): Secondary | ICD-10-CM | POA: Diagnosis not present

## 2018-11-23 ENCOUNTER — Telehealth: Payer: Self-pay

## 2018-11-23 NOTE — Telephone Encounter (Signed)
Unable to get in contact with the patient to convert their office visit with [facility] on 11/30/2018 into a doxy.me visit. I left a voicemail asking the patient to return my call. Office number was provided.   If patient calls back please convert their office visit into a doxy.me visit.

## 2018-11-25 NOTE — Telephone Encounter (Signed)
LMVM for James Webster, spouse to have pt call to make appt VV doxy.me 11-30-18 0830.

## 2018-11-30 ENCOUNTER — Ambulatory Visit: Payer: BLUE CROSS/BLUE SHIELD | Admitting: Adult Health

## 2018-12-30 IMAGING — CT CT ABD-PELV W/O CM
1 of 2 series · 15 of 32 positions shown, 19 images · non-contrast
Comparison: None.

CLINICAL DATA: RLQ pain and mid pelvic pain with constipation X 2
weeks to a month.

EXAM:
CT ABDOMEN AND PELVIS WITHOUT CONTRAST
TECHNIQUE: Multidetector CT imaging of the abdomen and pelvis was performed
following the standard protocol without IV contrast.

[Series 2: abd/pelvis w/(date) · axial · 0.91mm/px · z∈[-515,-30]mm · 15 of 107 slices shown, 19 images]
[im 5/107  soft-tissue]
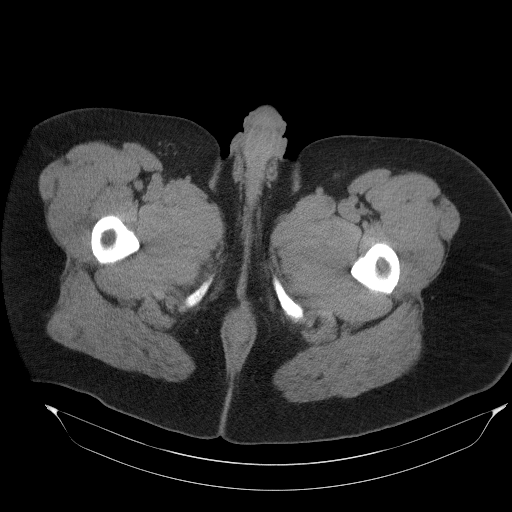
[im 5/107  bone]
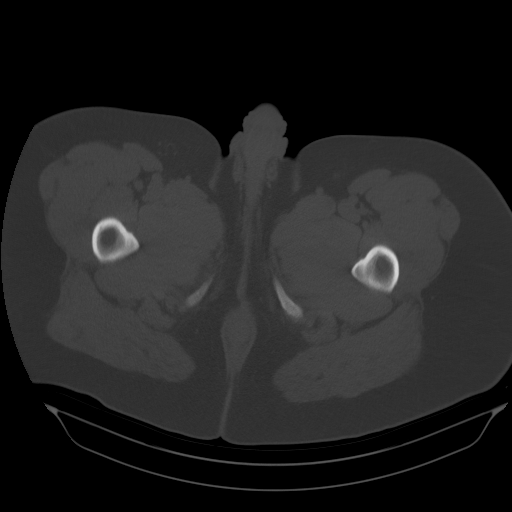
[im 14/107  soft-tissue]
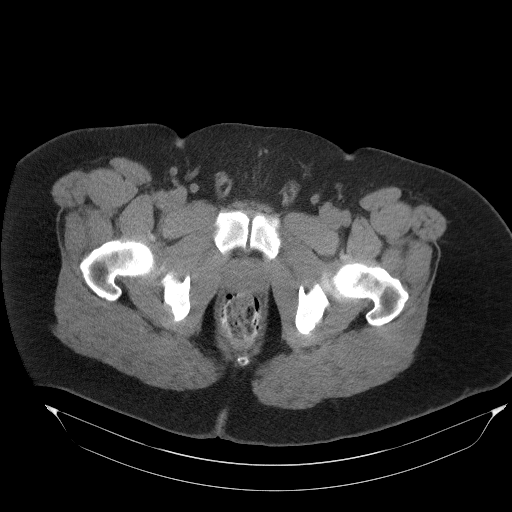
[im 23/107  soft-tissue]
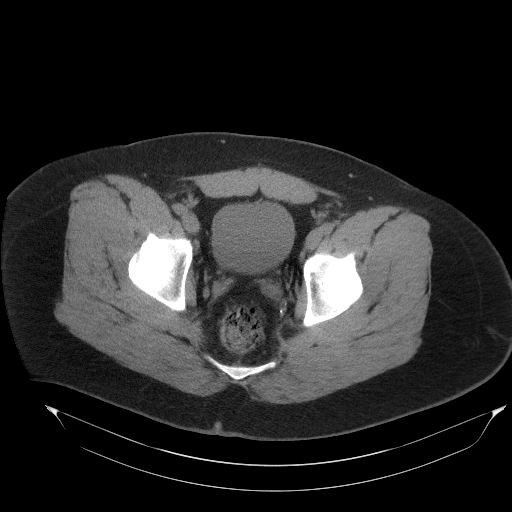
[im 31/107  soft-tissue]
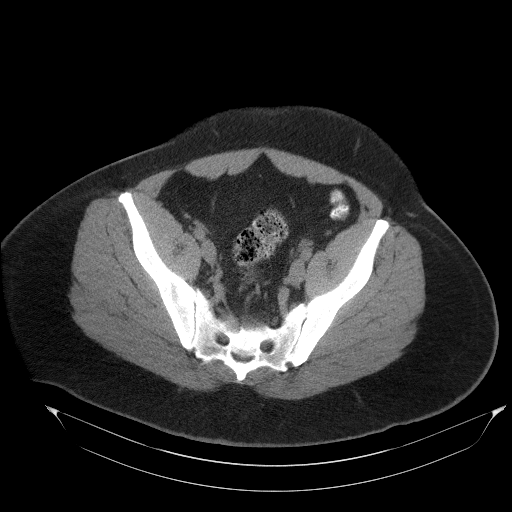
[im 36/107  soft-tissue]
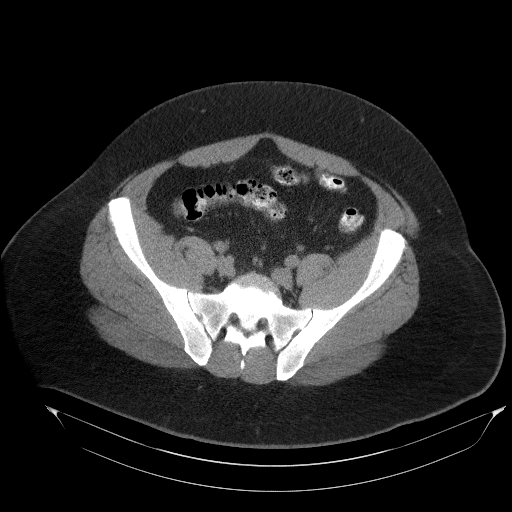
[im 45/107  soft-tissue]
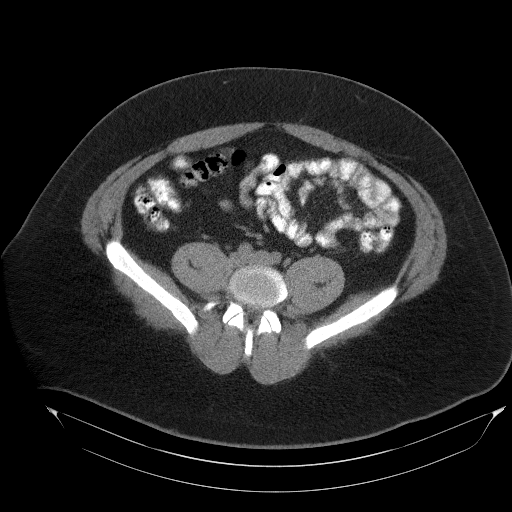
[im 54/107  soft-tissue]
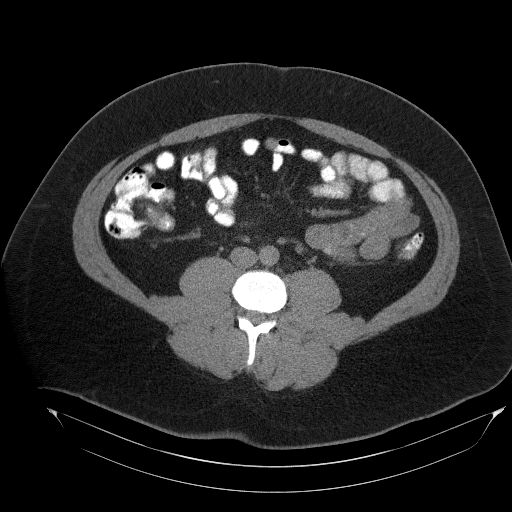
[im 62/107  soft-tissue]
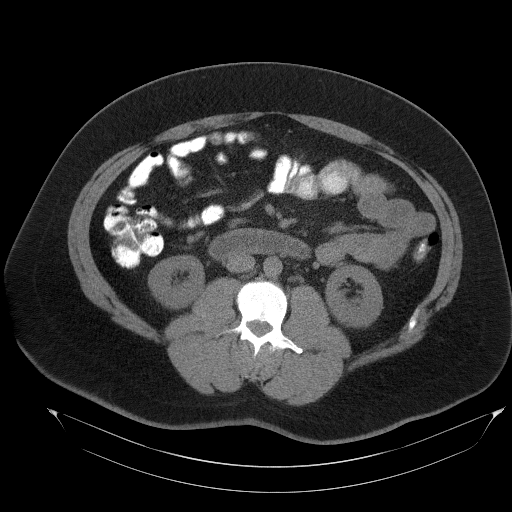
[im 71/107  soft-tissue]
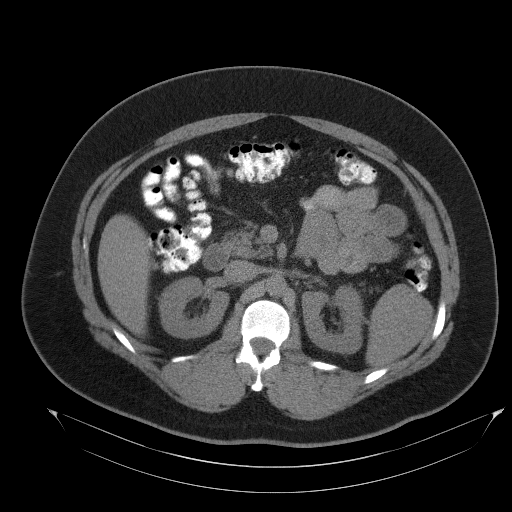
[im 71/107  bone]
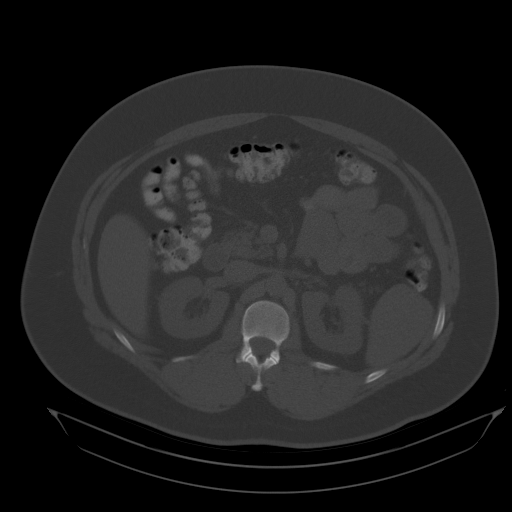
[im 76/107  soft-tissue]
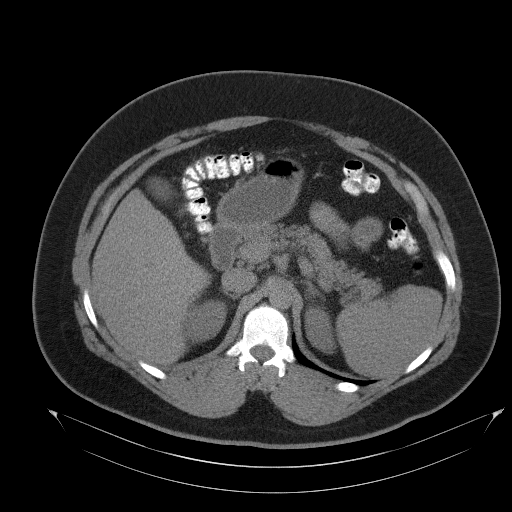
[im 84/107  soft-tissue]
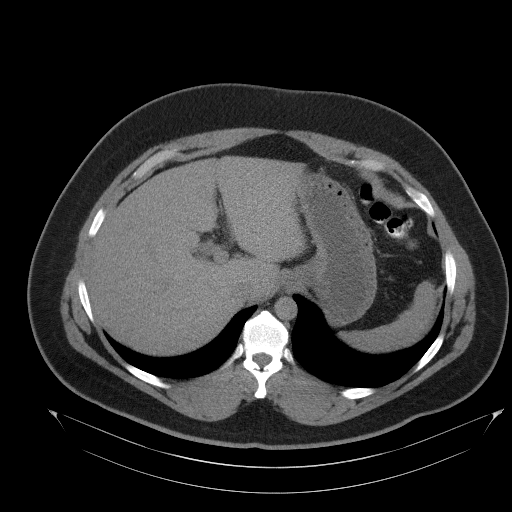
[im 89/107  lung]
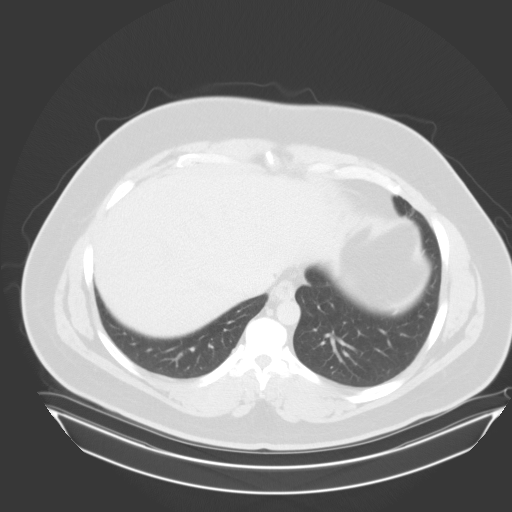
[im 93/107  soft-tissue]
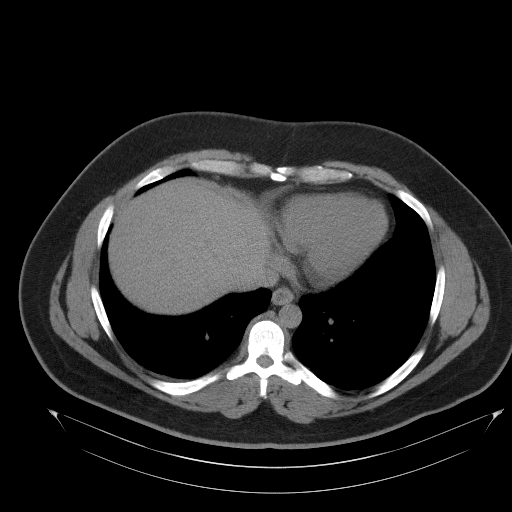
[im 93/107  lung]
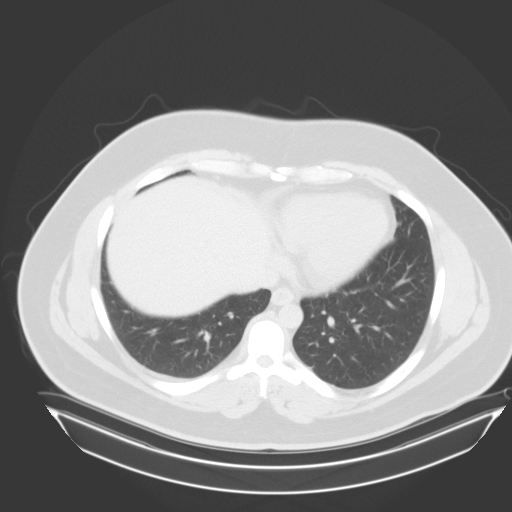
[im 98/107  lung]
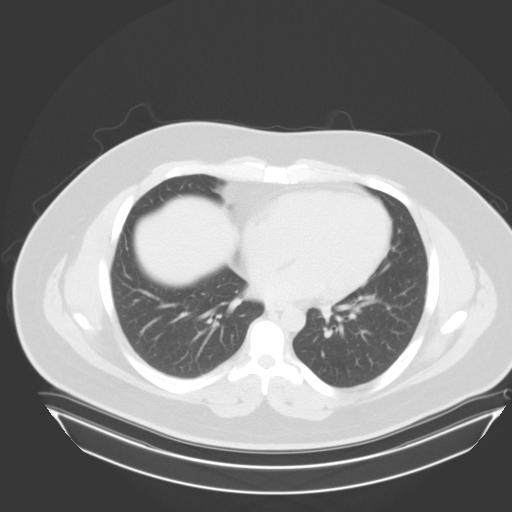
[im 102/107  soft-tissue]
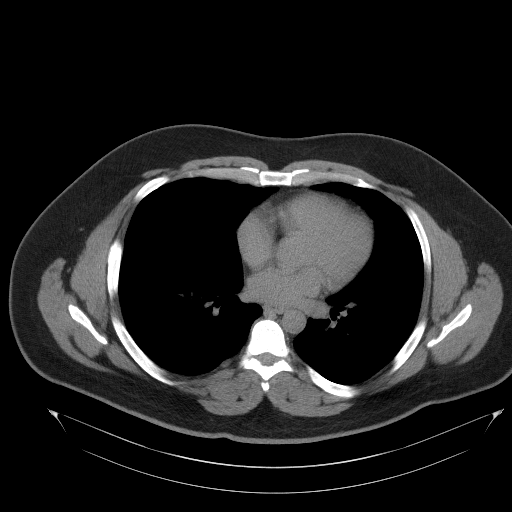
[im 102/107  lung]
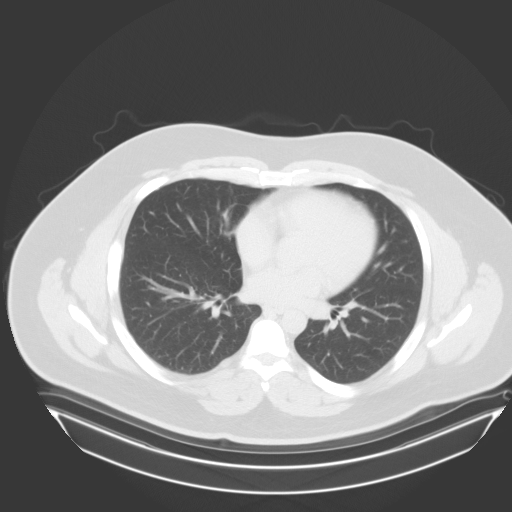

[15 of 32 positions shown; findings below may reference images not displayed]

FINDINGS: Lower chest: No acute abnormality.

Hepatobiliary: No focal liver abnormality is seen. No gallstones,
gallbladder wall thickening, or biliary dilatation.

Pancreas: Unremarkable. No pancreatic ductal dilatation or
surrounding inflammatory changes.

Spleen: Normal in size without focal abnormality.

Adrenals/Urinary Tract: Adrenal glands are unremarkable. Kidneys are
normal, without renal calculi, focal lesion, or hydronephrosis.
Bladder is unremarkable.

Stomach/Bowel: Stomach is within normal limits. Appendix appears
normal. No evidence of bowel wall thickening, distention, or
inflammatory changes.

Vascular/Lymphatic: No significant vascular findings are present. No
enlarged abdominal or pelvic lymph nodes.

Reproductive: Prostate is unremarkable.

Other: No abdominal wall hernia or abnormality. No abdominopelvic
ascites.

Musculoskeletal: No acute or significant osseous findings.
IMPRESSION: 1. No acute abdominal or pelvic pathology.
2. Normal appendix.

## 2019-03-09 DIAGNOSIS — G4733 Obstructive sleep apnea (adult) (pediatric): Secondary | ICD-10-CM | POA: Diagnosis not present

## 2019-05-12 ENCOUNTER — Emergency Department (HOSPITAL_COMMUNITY)
Admission: EM | Admit: 2019-05-12 | Discharge: 2019-05-13 | Disposition: A | Discharge status: Home / Self Care | Payer: BC Managed Care – PPO | Attending: Emergency Medicine | Admitting: Emergency Medicine

## 2019-05-12 ENCOUNTER — Emergency Department (HOSPITAL_COMMUNITY): Payer: BC Managed Care – PPO

## 2019-05-12 ENCOUNTER — Encounter (HOSPITAL_COMMUNITY): Payer: Self-pay | Admitting: Emergency Medicine

## 2019-05-12 ENCOUNTER — Other Ambulatory Visit: Payer: Self-pay

## 2019-05-12 DIAGNOSIS — Z79899 Other long term (current) drug therapy: Secondary | ICD-10-CM | POA: Insufficient documentation

## 2019-05-12 DIAGNOSIS — R55 Syncope and collapse: Secondary | ICD-10-CM | POA: Diagnosis not present

## 2019-05-12 DIAGNOSIS — R1084 Generalized abdominal pain: Secondary | ICD-10-CM | POA: Diagnosis not present

## 2019-05-12 DIAGNOSIS — R1011 Right upper quadrant pain: Secondary | ICD-10-CM | POA: Diagnosis not present

## 2019-05-12 DIAGNOSIS — R109 Unspecified abdominal pain: Secondary | ICD-10-CM | POA: Diagnosis not present

## 2019-05-12 DIAGNOSIS — R112 Nausea with vomiting, unspecified: Secondary | ICD-10-CM | POA: Diagnosis not present

## 2019-05-12 DIAGNOSIS — Z87891 Personal history of nicotine dependence: Secondary | ICD-10-CM | POA: Insufficient documentation

## 2019-05-12 DIAGNOSIS — K760 Fatty (change of) liver, not elsewhere classified: Secondary | ICD-10-CM | POA: Diagnosis not present

## 2019-05-12 LAB — BASIC METABOLIC PANEL
Anion gap: 13 (ref 5–15)
BUN: 14 mg/dL (ref 6–20)
CO2: 20 mmol/L — ABNORMAL LOW (ref 22–32)
Calcium: 9.2 mg/dL (ref 8.9–10.3)
Chloride: 103 mmol/L (ref 98–111)
Creatinine, Ser: 1.23 mg/dL (ref 0.61–1.24)
GFR calc Af Amer: >60 mL/min (ref >60)
GFR calc non Af Amer: >60 mL/min (ref >60)
Glucose, Bld: 95 mg/dL (ref 70–99)
Potassium: 3.6 mmol/L (ref 3.5–5.1)
Sodium: 136 mmol/L (ref 135–145)

## 2019-05-12 LAB — HEPATIC FUNCTION PANEL
ALT: 28 U/L (ref 0–44)
AST: 21 U/L (ref 15–41)
Albumin: 4.2 g/dL (ref 3.5–5.0)
Alkaline Phosphatase: 76 U/L (ref 38–126)
Bilirubin, Direct: 0.2 mg/dL (ref 0.0–0.2)
Indirect Bilirubin: 0.8 mg/dL (ref 0.3–0.9)
Total Bilirubin: 1 mg/dL (ref 0.3–1.2)
Total Protein: 7.4 g/dL (ref 6.5–8.1)

## 2019-05-12 LAB — CBC
HCT: 49.9 % (ref 39.0–52.0)
Hemoglobin: 16.9 g/dL (ref 13.0–17.0)
MCH: 29.3 pg (ref 26.0–34.0)
MCHC: 33.9 g/dL (ref 30.0–36.0)
MCV: 86.5 fL (ref 80.0–100.0)
Platelets: 273 10*3/uL (ref 150–400)
RBC: 5.77 MIL/uL (ref 4.22–5.81)
RDW: 12.6 % (ref 11.5–15.5)
WBC: 11.3 10*3/uL — ABNORMAL HIGH (ref 4.0–10.5)
nRBC: 0 % (ref 0.0–0.2)

## 2019-05-12 LAB — URINALYSIS, ROUTINE W REFLEX MICROSCOPIC
Bacteria, UA: NONE SEEN
Bilirubin Urine: NEGATIVE
Glucose, UA: NEGATIVE mg/dL
Hgb urine dipstick: NEGATIVE
Ketones, ur: 20 mg/dL — AB
Leukocytes,Ua: NEGATIVE
Nitrite: NEGATIVE
Protein, ur: 30 mg/dL — AB
Specific Gravity, Urine: 1.024 (ref 1.005–1.030)
pH: 5 (ref 5.0–8.0)

## 2019-05-12 LAB — CBG MONITORING, ED: Glucose-Capillary: 85 mg/dL (ref 70–99)

## 2019-05-12 LAB — LIPASE, BLOOD: Lipase: 32 U/L (ref 11–51)

## 2019-05-12 MED ORDER — HYOSCYAMINE SULFATE 0.125 MG PO TABS
0.1250 mg | ORAL_TABLET | Freq: Once | ORAL | Status: AC
  Administered 2019-05-12: 0.125 mg via ORAL
  Filled 2019-05-12: qty 1

## 2019-05-12 MED ORDER — ONDANSETRON 4 MG PO TBDP: 4.0000 mg | ORAL_TABLET | Freq: Three times a day (TID) | ORAL | 0 refills | Status: DC | PRN

## 2019-05-12 MED ORDER — ALUM & MAG HYDROXIDE-SIMETH 200-200-20 MG/5ML PO SUSP
30.0000 mL | Freq: Once | ORAL | Status: AC
  Administered 2019-05-13: 30 mL via ORAL
  Filled 2019-05-12: qty 30

## 2019-05-12 MED ORDER — DICYCLOMINE HCL 20 MG PO TABS: 20.0000 mg | ORAL_TABLET | Freq: Three times a day (TID) | ORAL | 0 refills | Status: DC | PRN

## 2019-05-12 MED ORDER — ONDANSETRON HCL 4 MG/2ML IJ SOLN
4.0000 mg | Freq: Once | INTRAMUSCULAR | Status: AC
  Administered 2019-05-12: 4 mg via INTRAVENOUS
  Filled 2019-05-12: qty 2

## 2019-05-12 MED ORDER — SODIUM CHLORIDE 0.9 % IV BOLUS
1000.0000 mL | Freq: Once | INTRAVENOUS | Status: AC
  Administered 2019-05-12: 1000 mL via INTRAVENOUS

## 2019-05-12 MED ORDER — IOHEXOL 300 MG/ML  SOLN
100.0000 mL | Freq: Once | INTRAMUSCULAR | Status: AC | PRN
  Administered 2019-05-12: 100 mL via INTRAVENOUS

## 2019-05-12 MED ORDER — SODIUM CHLORIDE 0.9% FLUSH
3.0000 mL | Freq: Once | INTRAVENOUS | Status: AC
  Administered 2019-05-12: 3 mL via INTRAVENOUS

## 2019-05-12 MED ORDER — MORPHINE SULFATE (PF) 4 MG/ML IV SOLN
4.0000 mg | Freq: Once | INTRAVENOUS | Status: AC
  Administered 2019-05-12: 4 mg via INTRAVENOUS
  Filled 2019-05-12: qty 1

## 2019-05-12 MED ORDER — ONDANSETRON 4 MG PO TBDP
4.0000 mg | ORAL_TABLET | Freq: Once | ORAL | Status: AC
  Administered 2019-05-12: 4 mg via ORAL
  Filled 2019-05-12: qty 1

## 2019-05-12 MED ORDER — LIDOCAINE VISCOUS HCL 2 % MT SOLN
15.0000 mL | Freq: Once | OROMUCOSAL | Status: AC
  Administered 2019-05-13: 15 mL via ORAL
  Filled 2019-05-12: qty 15

## 2019-05-12 NOTE — ED Triage Notes (Signed)
Pt here from work with c/o near syncopal episode also with /n/v , no fevers no sob

## 2019-05-12 NOTE — ED Provider Notes (Signed)
[facility] EMERGENCY DEPARTMENT Provider Note   CSN: [id number] Arrival date & time: 05/12/19  1515     History   Chief Complaint Chief Complaint  Patient presents with  . Near Syncope    HPI James Webster is a 43 y.o. male.     The history is provided by the patient and medical records. No language interpreter was used.  Near Syncope   James Webster is a 43 y.o. male who presents to the Emergency Department complaining of nausea, vomiting and near syncope. He presents to the emergency department for evaluation of vomiting and near syncope. He was in his routine state of health until about 2 PM this afternoon when he went to eat a piece of cheese. Right after eating the cheese he developed severe nausea, generalized abdominal cramping and felt like he would pass out. He did not syncopal eyes. He did begin to vomit. He has experienced multiple recurrent episodes of nausea and vomiting and complains of persistent severe nausea. He denies any fevers, chest pain, shortness of breath, leg swelling or pain. He has no medical problems and takes no medications. No prior similar symptoms. No known bad food exposures. He does not smoke, use drugs. Uses occasional alcohol. No exposures to COVID-19. Past Medical History:  Diagnosis Date  . DDD (degenerative disc disease), lumbosacral 11/08/2015  . OSA on CPAP     Patient Active Problem List   Diagnosis Date Noted  . DDD (degenerative disc disease), lumbosacral 11/08/2015    Past Surgical History:  Procedure Laterality Date  . none          Home Medications    Prior to Admission medications   Medication Sig Start Date End Date Taking? Authorizing Provider  cetirizine (ZYRTEC) 10 MG tablet Take 10 mg by mouth daily.    [provider]  lidocaine (XYLOCAINE) 5 % ointment Apply 1 application topically as needed. 03/23/17   Jamse Arn, MD  methocarbamol (ROBAXIN) 500 MG tablet Take 2 tablets  (1,000 mg total) by mouth every 8 (eight) hours as needed for muscle spasms. 03/11/17   Jamse Arn, MD  Misc. Devices (NASAL SPRAY BOTTLE) MISC by Does not apply route.    [provider]  Multiple Vitamins-Minerals (MULTIVITAMIN ADULT PO) Take by mouth.    [provider]  SAXENDA 18 MG/3ML SOPN daily. 06/11/17   [provider]    Family History Family History  Problem Relation Age of Onset  . Arthritis Mother   . Gallbladder disease Mother   . Healthy Father   . Gallbladder disease Sister   . Heart disease Maternal Grandfather   . Cancer Maternal Aunt   . Colon cancer Neg Hx   . Stomach cancer Neg Hx   . Rectal cancer Neg Hx   . Esophageal cancer Neg Hx   . Liver cancer Neg Hx     Social History Social History   Tobacco Use  . Smoking status: Former Smoker    Packs/day: 1.00    Years: 3.00    Pack years: 3.00    Types: Cigarettes    Quit date: 05/08/1996    Years since quitting: 23.0  . Smokeless tobacco: Never Used  Substance Use Topics  . Alcohol use: Yes    Alcohol/week: 0.0 standard drinks    Comment: moderate  . Drug use: No     Allergies   Patient has no known allergies.   Review of Systems Review of Systems  Cardiovascular: Positive for near-syncope.  All other systems reviewed and are negative.    Physical Exam Updated Vital Signs BP 123/73 (BP Location: Left Arm)   Pulse 87   Temp 98.1 F (36.7 C) (Oral)   Resp 19   SpO2 96%   Physical Exam Vitals signs and nursing note reviewed.  Constitutional:      Appearance: He is well-developed.  HENT:     Head: Normocephalic and atraumatic.  Cardiovascular:     Rate and Rhythm: Normal rate and regular rhythm.  Pulmonary:     Effort: Pulmonary effort is normal. No respiratory distress.  Abdominal:     Palpations: Abdomen is soft.     Tenderness: There is no guarding or rebound.     Comments: Moderate generalized abdominal tenderness, greatest over the RUQ   Musculoskeletal:        General: No tenderness.  Skin:    General: Skin is warm and dry.  Neurological:     Mental Status: He is alert and oriented to person, place, and time.  Psychiatric:        Behavior: Behavior normal.      ED Treatments / Results  Labs (all labs ordered are listed, but only abnormal results are displayed) Labs Reviewed  BASIC METABOLIC PANEL - Abnormal; Notable for the following components:      Result Value   CO2 20 (*)    All other components within normal limits  CBC - Abnormal; Notable for the following components:   WBC 11.3 (*)    All other components within normal limits  URINALYSIS, ROUTINE W REFLEX MICROSCOPIC - Abnormal; Notable for the following components:   Ketones, ur 20 (*)    Protein, ur 30 (*)    All other components within normal limits  HEPATIC FUNCTION PANEL  LIPASE, BLOOD  CBG MONITORING, ED    EKG EKG Interpretation  Date/Time:  Thursday May 12 2019 15:26:08 EST Ventricular Rate:  94 PR Interval:  176 QRS Duration: 72 QT Interval:  346 QTC Calculation: 432 R Axis:   39 Text Interpretation: Normal sinus rhythm Cannot rule out Anterior infarct , age undetermined Abnormal ECG no prior available for comparison Confirmed by Quintella Reichert [phone]) on 05/12/2019 8:28:38 PM   Radiology No results found.  Procedures Procedures (including critical care time)  Medications Ordered in ED Medications  sodium chloride flush (NS) 0.9 % injection 3 mL (has no administration in time range)  sodium chloride 0.9 % bolus 1,000 mL (has no administration in time range)  ondansetron (ZOFRAN) injection 4 mg (has no administration in time range)  hyoscyamine (LEVSIN) tablet 0.125 mg (has no administration in time range)  ondansetron (ZOFRAN-ODT) disintegrating tablet 4 mg (4 mg Oral Given 05/12/19 1537)     Initial Impression / Assessment and Plan / ED Course  I have reviewed the triage vital signs and the nursing notes.   Pertinent labs & imaging results that were available during my care of the patient were reviewed by me and considered in my medical decision making (see chart for details).        Patient here for evaluation of nausea, vomiting, near syncope. He does have tenderness on examination without peritoneal findings. Initial exam patient had right upper quadrant tenderness. Ultrasound was obtained and is negative for acute abnormality. On repeat assessment he has significant right lower quadrant tenderness. Plan to obtain CT abdomen pelvis to further evaluate.  CT abd/pelvis without acute abnormality.  Pt without recurrent vomiting in  ED.  Plan to d/c home with outpatient follow up, return precautions.    Presentation is not c/w ACS, PE, sepsis, bowel obstruction.   Final Clinical Impressions(s) / ED Diagnoses   Final diagnoses:  RUQ abdominal pain    ED Discharge Orders    None       Quintella Reichert, MD 05/12/19 2322

## 2019-05-13 NOTE — ED Notes (Signed)
Patient verbalizes understanding of discharge instructions. Opportunity for questioning and answers were provided. Armband removed by staff, pt discharged from ED.  

## 2019-05-28 DIAGNOSIS — Z20828 Contact with and (suspected) exposure to other viral communicable diseases: Secondary | ICD-10-CM | POA: Diagnosis not present

## 2019-06-23 DIAGNOSIS — R062 Wheezing: Secondary | ICD-10-CM | POA: Diagnosis not present

## 2019-06-23 DIAGNOSIS — R0602 Shortness of breath: Secondary | ICD-10-CM | POA: Diagnosis not present

## 2019-06-23 DIAGNOSIS — Z20828 Contact with and (suspected) exposure to other viral communicable diseases: Secondary | ICD-10-CM | POA: Diagnosis not present

## 2019-08-05 DIAGNOSIS — G4733 Obstructive sleep apnea (adult) (pediatric): Secondary | ICD-10-CM | POA: Diagnosis not present

## 2019-09-06 ENCOUNTER — Encounter: Payer: Self-pay | Admitting: Adult Health

## 2019-09-08 ENCOUNTER — Ambulatory Visit: Payer: BLUE CROSS/BLUE SHIELD | Admitting: Adult Health

## 2019-09-08 ENCOUNTER — Encounter: Payer: Self-pay | Admitting: Adult Health

## 2019-09-08 ENCOUNTER — Other Ambulatory Visit: Payer: Self-pay

## 2019-09-08 VITALS — BP 127/85 | HR 85 | Temp 97.5°F | Ht 69.0 in | Wt 289.4 lb

## 2019-09-08 DIAGNOSIS — G4733 Obstructive sleep apnea (adult) (pediatric): Secondary | ICD-10-CM | POA: Diagnosis not present

## 2019-09-08 DIAGNOSIS — Z9989 Dependence on other enabling machines and devices: Secondary | ICD-10-CM | POA: Diagnosis not present

## 2019-09-08 NOTE — Progress Notes (Signed)
Order for mask refit sent to [facility] via community message. Confirmation received that the order transmitted was successful.  

## 2019-09-08 NOTE — Patient Instructions (Addendum)
Continue using CPAP nightly and greater than 4 hours each night Mask refitting If your symptoms worsen or you develop new symptoms please let us know.   Please call [facility] at [phone], and press option 1 when prompted. Their customer service representatives will be glad to assist you. If they are unable to answer, please leave a message and they will call you back. Make sure to leave your name and return phone number.

## 2019-09-08 NOTE — Progress Notes (Addendum)
PATIENT: James Webster DOB: 11/23/1975  REASON FOR VISIT: follow up HISTORY FROM: patient  HISTORY OF PRESENT ILLNESS: Today 09/08/19:  James Webster is a 44 year old male with a history of obstructive sleep apnea on CPAP.  His download indicates that he uses machine 23 out of 30 days for compliance of 77%.  He uses machine greater than 4 hours 21 days for compliance of 70%.  On average he uses his machine 5 hours and 47 minutes.  Residual AHI is 1.7 on 7 to 11 cm of water with EPR of 3.  Leak in the 95th percentile is 10.5.  He reports that the mask leaks.  He states that this typically wakes him up.  He feels that he is not getting good sleep while using CPAP for this reason.  He denies any new issues  HISTORY James Webster is a 44 year old male with a history of obstructive sleep apnea on CPAP.  He returns today for follow-up.  His CPAP download indicates that he uses machine 23 out of 56 days for compliance of 41%.  He uses machine greater than 4 hours 30 days for compliance to 23%.  He reports that he is now working 1 PM to 10 PM.  He states that he typically goes to the gym after work and does not go to bed till 1 or 2 AM.  He states occasionally if his wife has to get up early he will sleep in another bedroom and unfortunately is unable to use his CPAP.  His average usage is 4 hours and 47 minutes.  He is on a pressure of 5 to 15 cm of water with EPR of 3.  His residual AHI is 1.5.  He does not have a significant leak.  The patient weighed 295 when he had a sleep study. He now weighs 236.  He returns today for evaluation.   REVIEW OF SYSTEMS: Out of a complete 14 system review of symptoms, the patient complains only of the following symptoms, and all other reviewed systems are negative.  FSS 42 ESS 9  ALLERGIES: No Known Allergies  HOME MEDICATIONS: Outpatient Medications Prior to Visit  Medication Sig Dispense Refill  . cetirizine (ZYRTEC) 10 MG tablet Take 10 mg by mouth  daily.    Marland Kitchen dicyclomine (BENTYL) 20 MG tablet Take 1 tablet (20 mg total) by mouth 3 (three) times daily as needed for spasms. 15 tablet 0  . lidocaine (XYLOCAINE) 5 % ointment Apply 1 application topically as needed. 35.44 g 0  . methocarbamol (ROBAXIN) 500 MG tablet Take 2 tablets (1,000 mg total) by mouth every 8 (eight) hours as needed for muscle spasms. 180 tablet 1  . Misc. Devices (NASAL SPRAY BOTTLE) MISC by Does not apply route.    . Multiple Vitamins-Minerals (MULTIVITAMIN ADULT PO) Take by mouth.    . ondansetron (ZOFRAN ODT) 4 MG disintegrating tablet Take 1 tablet (4 mg total) by mouth every 8 (eight) hours as needed for nausea or vomiting. 12 tablet 0  . SAXENDA 18 MG/3ML SOPN daily.  11   No facility-administered medications prior to visit.    PAST MEDICAL HISTORY: Past Medical History:  Diagnosis Date  . DDD (degenerative disc disease), lumbosacral 11/08/2015  . OSA on CPAP     PAST SURGICAL HISTORY: Past Surgical History:  Procedure Laterality Date  . none      FAMILY HISTORY: Family History  Problem Relation Age of Onset  . Arthritis Mother   . Gallbladder disease  Mother   . Healthy Father   . Gallbladder disease Sister   . Heart disease Maternal Grandfather   . Cancer Maternal Aunt   . Colon cancer Neg Hx   . Stomach cancer Neg Hx   . Rectal cancer Neg Hx   . Esophageal cancer Neg Hx   . Liver cancer Neg Hx     SOCIAL HISTORY: Social History   Socioeconomic History  . Marital status: Married    Spouse name: Not on file  . Number of children: 0  . Years of education: college  . Highest education level: Not on file  Occupational History  . Occupation: Environmental health practitioner  Tobacco Use  . Smoking status: Former Smoker    Packs/day: 1.00    Years: 3.00    Pack years: 3.00    Types: Cigarettes    Quit date: 05/08/1996    Years since quitting: 23.3  . Smokeless tobacco: Never Used  Substance and Sexual Activity  . Alcohol use: Yes    Alcohol/week:  0.0 standard drinks    Comment: moderate  . Drug use: No  . Sexual activity: Not on file  Other Topics Concern  . Not on file  Social History Narrative   Drinks about 1/2 pot of coffee a day    Social Determinants of Health   Financial Resource Strain:   . Difficulty of Paying Living Expenses: Not on file  Food Insecurity:   . Worried About Charity fundraiser in the Last Year: Not on file  . Ran Out of Food in the Last Year: Not on file  Transportation Needs:   . Lack of Transportation (Medical): Not on file  . Lack of Transportation (Non-Medical): Not on file  Physical Activity:   . Days of Exercise per Week: Not on file  . Minutes of Exercise per Session: Not on file  Stress:   . Feeling of Stress : Not on file  Social Connections:   . Frequency of Communication with Friends and Family: Not on file  . Frequency of Social Gatherings with Friends and Family: Not on file  . Attends Religious Services: Not on file  . Active Member of Clubs or Organizations: Not on file  . Attends Archivist Meetings: Not on file  . Marital Status: Not on file  Intimate Partner Violence:   . Fear of Current or Ex-Partner: Not on file  . Emotionally Abused: Not on file  . Physically Abused: Not on file  . Sexually Abused: Not on file      PHYSICAL EXAM  Vitals:   09/08/19 0930  BP: 127/85  Pulse: 85  Temp: (!) 97.5 F (36.4 C)  TempSrc: Oral  Weight: 289 lb 6.4 oz (131.3 kg)  Height: 5\' 9"  (1.753 m)   Body mass index is 42.74 kg/m.  Generalized: Well developed, in no acute distress  Chest: Lungs clear to auscultation bilaterally  Neurological examination  Mentation: Alert oriented to time, place, history taking. Follows all commands speech and language fluent Cranial nerve II-XII: Extraocular movements were full, visual field were full on confrontational test Head turning and shoulder shrug  were normal and symmetric. Motor: The motor testing reveals 5 over 5  strength of all 4 extremities. Good symmetric motor tone is noted throughout.  Sensory: Sensory testing is intact to soft touch on all 4 extremities. No evidence of extinction is noted.  Gait and station: Gait is normal.    DIAGNOSTIC DATA (LABS, IMAGING, TESTING) -  I reviewed patient records, labs, notes, testing and imaging myself where available.  Lab Results  Component Value Date   WBC 11.3 (H) 05/12/2019   HGB 16.9 05/12/2019   HCT 49.9 05/12/2019   MCV 86.5 05/12/2019   PLT 273 05/12/2019      Component Value Date/Time   NA 136 05/12/2019 1528   K 3.6 05/12/2019 1528   CL 103 05/12/2019 1528   CO2 20 (L) 05/12/2019 1528   GLUCOSE 95 05/12/2019 1528   BUN 14 05/12/2019 1528   CREATININE 1.23 05/12/2019 1528   CALCIUM 9.2 05/12/2019 1528   PROT 7.4 05/12/2019 2100   ALBUMIN 4.2 05/12/2019 2100   AST 21 05/12/2019 2100   ALT 28 05/12/2019 2100   ALKPHOS 76 05/12/2019 2100   BILITOT 1.0 05/12/2019 2100   GFRNONAA >60 05/12/2019 1528   GFRAA >60 05/12/2019 1528      ASSESSMENT AND PLAN 44 y.o. year old male  has a past medical history of DDD (degenerative disc disease), lumbosacral (11/08/2015) and OSA on CPAP. here with:  1. OSA on CPAP  - CPAP compliance is good - Good treatment of AHI  -Order sent for mask refitting due to leakage - Encourage patient to use CPAP nightly and > 4 hours each night - F/U in 6 months or sooner if needed   I spent 15 minutes of face-to-face and non-face-to-face time with patient.  This included previsit chart review, study review,  electronic health record documentation, patient education.  Ward Givens, MSN, NP-C 09/08/2019, 9:31 AM [facility] [address] [place], [place] [address] [phone]  I reviewed the above note and documentation by the Nurse Practitioner and agree with the history, exam, assessment and plan as outlined above. I was available for consultation. Star Age, MD,  PhD [facility])

## 2019-09-30 DIAGNOSIS — Z23 Encounter for immunization: Secondary | ICD-10-CM | POA: Diagnosis not present

## 2019-10-10 DIAGNOSIS — H40053 Ocular hypertension, bilateral: Secondary | ICD-10-CM | POA: Diagnosis not present

## 2019-10-27 DIAGNOSIS — Z23 Encounter for immunization: Secondary | ICD-10-CM | POA: Diagnosis not present

## 2019-12-20 DIAGNOSIS — G4733 Obstructive sleep apnea (adult) (pediatric): Secondary | ICD-10-CM | POA: Diagnosis not present

## 2020-03-08 ENCOUNTER — Ambulatory Visit: Payer: BC Managed Care – PPO | Admitting: Adult Health

## 2020-05-08 ENCOUNTER — Telehealth: Payer: Self-pay | Admitting: Adult Health

## 2020-05-08 NOTE — Telephone Encounter (Signed)
Pt is asking for a call  to discuss the Optima O2 vent, he states the mask are not working for him.  Please call

## 2020-05-08 NOTE — Telephone Encounter (Signed)
Spoke to pt and made appt for him to come in 05-10-20 at 0830 for cpap and then discuss other options.

## 2020-05-08 NOTE — Telephone Encounter (Signed)
I called and was not able to [facility] for him to call back. I held appt on MM/NP shcedule 05-10-20 at 0900 to discuss. Cpap, oral device.

## 2020-05-10 ENCOUNTER — Encounter: Payer: Self-pay | Admitting: Adult Health

## 2020-05-10 ENCOUNTER — Ambulatory Visit: Payer: BC Managed Care – PPO | Admitting: Adult Health

## 2020-05-10 ENCOUNTER — Other Ambulatory Visit: Payer: Self-pay

## 2020-05-10 VITALS — BP 128/80 | HR 86 | Ht 69.0 in | Wt 278.4 lb

## 2020-05-10 DIAGNOSIS — Z9989 Dependence on other enabling machines and devices: Secondary | ICD-10-CM | POA: Diagnosis not present

## 2020-05-10 DIAGNOSIS — G4733 Obstructive sleep apnea (adult) (pediatric): Secondary | ICD-10-CM

## 2020-05-10 NOTE — Progress Notes (Addendum)
PATIENT: James Webster DOB: 1975-08-27  REASON FOR VISIT: follow up HISTORY FROM: patient  HISTORY OF PRESENT ILLNESS: Today 05/10/20:  James Webster is a 44 year old male with a history of obstructive sleep apnea on CPAP.  His download indicates that he uses machine 11 out of 30 days for compliance of 37%.  He uses machine greater than 4 hours each night.  On average he uses it 5 hours and 46 minutes.  His residual AHI is 3.3 on 7 to 11 cm of water with EPR 3.  Leak in the 95th percentile is 37.1 L/min.  He reports that he is unable to use the CPAP.  Reports that he has tried it for 3 years and cannot tolerate it.  He reports that has not improved his symptoms at all.  He tried 3 different mask with no benefit.  He does note that he has had weight gain since the last visit.  He returns today for an evaluation.  HISTORY James Webster is a 44 year old male with a history of obstructive sleep apnea on CPAP.  His download indicates that he uses machine 23 out of 30 days for compliance of 77%.  He uses machine greater than 4 hours 21 days for compliance of 70%.  On average he uses his machine 5 hours and 47 minutes.  Residual AHI is 1.7 on 7 to 11 cm of water with EPR of 3.  Leak in the 95th percentile is 10.5.  He reports that the mask leaks.  He states that this typically wakes him up.  He feels that he is not getting good sleep while using CPAP for this reason.  He denies any new issues  REVIEW OF SYSTEMS: Out of a complete 14 system review of symptoms, the patient complains only of the following symptoms, and all other reviewed systems are negative.  FSS 44 ESS 12  ALLERGIES: No Known Allergies  HOME MEDICATIONS: Outpatient Medications Prior to Visit  Medication Sig Dispense Refill  . cetirizine (ZYRTEC) 10 MG tablet Take 10 mg by mouth daily.    Marland Kitchen dicyclomine (BENTYL) 20 MG tablet Take 1 tablet (20 mg total) by mouth 3 (three) times daily as needed for spasms. 15 tablet 0  .  lidocaine (XYLOCAINE) 5 % ointment Apply 1 application topically as needed. 35.44 g 0  . methocarbamol (ROBAXIN) 500 MG tablet Take 2 tablets (1,000 mg total) by mouth every 8 (eight) hours as needed for muscle spasms. 180 tablet 1  . Misc. Devices (NASAL SPRAY BOTTLE) MISC by Does not apply route.    . Multiple Vitamins-Minerals (MULTIVITAMIN ADULT PO) Take by mouth.    . ondansetron (ZOFRAN ODT) 4 MG disintegrating tablet Take 1 tablet (4 mg total) by mouth every 8 (eight) hours as needed for nausea or vomiting. 12 tablet 0  . SAXENDA 18 MG/3ML SOPN daily.  11   No facility-administered medications prior to visit.    PAST MEDICAL HISTORY: Past Medical History:  Diagnosis Date  . DDD (degenerative disc disease), lumbosacral 11/08/2015  . OSA on CPAP     PAST SURGICAL HISTORY: Past Surgical History:  Procedure Laterality Date  . none      FAMILY HISTORY: Family History  Problem Relation Age of Onset  . Arthritis Mother   . Gallbladder disease Mother   . Healthy Father   . Gallbladder disease Sister   . Heart disease Maternal Grandfather   . Cancer Maternal Aunt   . Colon cancer Neg Hx   .  Stomach cancer Neg Hx   . Rectal cancer Neg Hx   . Esophageal cancer Neg Hx   . Liver cancer Neg Hx     SOCIAL HISTORY: Social History   Socioeconomic History  . Marital status: Married    Spouse name: Not on file  . Number of children: 0  . Years of education: college  . Highest education level: Not on file  Occupational History  . Occupation: Environmental health practitioner  Tobacco Use  . Smoking status: Former Smoker    Packs/day: 1.00    Years: 3.00    Pack years: 3.00    Types: Cigarettes    Quit date: 05/08/1996    Years since quitting: 24.0  . Smokeless tobacco: Never Used  Substance and Sexual Activity  . Alcohol use: Yes    Alcohol/week: 0.0 standard drinks    Comment: moderate  . Drug use: No  . Sexual activity: Not on file  Other Topics Concern  . Not on file  Social  History Narrative   Drinks about 1/2 pot of coffee a day    Social Determinants of Health   Financial Resource Strain:   . Difficulty of Paying Living Expenses: Not on file  Food Insecurity:   . Worried About Charity fundraiser in the Last Year: Not on file  . Ran Out of Food in the Last Year: Not on file  Transportation Needs:   . Lack of Transportation (Medical): Not on file  . Lack of Transportation (Non-Medical): Not on file  Physical Activity:   . Days of Exercise per Week: Not on file  . Minutes of Exercise per Session: Not on file  Stress:   . Feeling of Stress : Not on file  Social Connections:   . Frequency of Communication with Friends and Family: Not on file  . Frequency of Social Gatherings with Friends and Family: Not on file  . Attends Religious Services: Not on file  . Active Member of Clubs or Organizations: Not on file  . Attends Archivist Meetings: Not on file  . Marital Status: Not on file  Intimate Partner Violence:   . Fear of Current or Ex-Partner: Not on file  . Emotionally Abused: Not on file  . Physically Abused: Not on file  . Sexually Abused: Not on file      PHYSICAL EXAM  Vitals:   05/10/20 0836  BP: 128/80  Pulse: 86  Weight: 278 lb 6.4 oz (126.3 kg)  Height: 5\' 9"  (1.753 m)   Body mass index is 41.11 kg/m.  Generalized: Well developed, in no acute distress  Chest: Lungs clear to auscultation bilaterally  Neurological examination  Mentation: Alert oriented to time, place, history taking. Follows all commands speech and language fluent Cranial nerve II-XII: Extraocular movements were full, visual field were full on confrontational test Head turning and shoulder shrug  were normal and symmetric. Motor: The motor testing reveals 5 over 5 strength of all 4 extremities. Good symmetric motor tone is noted throughout.  Sensory: Sensory testing is intact to soft touch on all 4 extremities. No evidence of extinction is noted.    Gait and station: Gait is normal.    DIAGNOSTIC DATA (LABS, IMAGING, TESTING) - I reviewed patient records, labs, notes, testing and imaging myself where available.  Lab Results  Component Value Date   WBC 11.3 (H) 05/12/2019   HGB 16.9 05/12/2019   HCT 49.9 05/12/2019   MCV 86.5 05/12/2019   PLT 273  05/12/2019      Component Value Date/Time   NA 136 05/12/2019 1528   K 3.6 05/12/2019 1528   CL 103 05/12/2019 1528   CO2 20 (L) 05/12/2019 1528   GLUCOSE 95 05/12/2019 1528   BUN 14 05/12/2019 1528   CREATININE 1.23 05/12/2019 1528   CALCIUM 9.2 05/12/2019 1528   PROT 7.4 05/12/2019 2100   ALBUMIN 4.2 05/12/2019 2100   AST 21 05/12/2019 2100   ALT 28 05/12/2019 2100   ALKPHOS 76 05/12/2019 2100   BILITOT 1.0 05/12/2019 2100   GFRNONAA >60 05/12/2019 1528   GFRAA >60 05/12/2019 1528     ASSESSMENT AND PLAN 44 y.o. year old male  has a past medical history of DDD (degenerative disc disease), lumbosacral (11/08/2015) and OSA on CPAP. here with:  1. OSA on CPAP  - CPAP compliance suboptimal - Good treatment of AHI when using the CPAP -Discussed alternative treatments to CPAP therapy including surgery, weight loss, dental device and inspire device. --Patient plans to start with weight loss.  States that he is not sure if he is completely ready to give up CPAP therapy yet. - F/U in 6 months or sooner if needed   I spent 30 minutes of face-to-face and non-face-to-face time with patient.  This included previsit chart review, lab review, study review, order entry, electronic health record documentation, patient education.  Ward Givens, MSN, NP-C 05/10/2020, 8:35 AM [facility] [address] [place], [place] [address] [phone]  I reviewed the above note and documentation by the Nurse Practitioner and agree with the history, exam, assessment and plan as outlined above. I was available for consultation. Star Age, MD, PhD [facility])

## 2020-05-10 NOTE — Patient Instructions (Signed)
Try weight loss Speak to dentist about device If your symptoms worsen or you develop new symptoms please let us know.

## 2020-05-11 DIAGNOSIS — Z7189 Other specified counseling: Secondary | ICD-10-CM | POA: Diagnosis not present

## 2020-05-11 DIAGNOSIS — Z23 Encounter for immunization: Secondary | ICD-10-CM | POA: Diagnosis not present

## 2020-05-12 DIAGNOSIS — Z23 Encounter for immunization: Secondary | ICD-10-CM | POA: Diagnosis not present

## 2020-06-19 DIAGNOSIS — M79652 Pain in left thigh: Secondary | ICD-10-CM | POA: Diagnosis not present

## 2020-06-19 DIAGNOSIS — M545 Low back pain, unspecified: Secondary | ICD-10-CM | POA: Diagnosis not present

## 2020-06-19 DIAGNOSIS — M79605 Pain in left leg: Secondary | ICD-10-CM | POA: Diagnosis not present

## 2020-07-03 DIAGNOSIS — M79652 Pain in left thigh: Secondary | ICD-10-CM | POA: Diagnosis not present

## 2020-07-03 DIAGNOSIS — M79605 Pain in left leg: Secondary | ICD-10-CM | POA: Diagnosis not present

## 2020-07-03 DIAGNOSIS — M545 Low back pain, unspecified: Secondary | ICD-10-CM | POA: Diagnosis not present

## 2020-07-28 DIAGNOSIS — M545 Low back pain, unspecified: Secondary | ICD-10-CM | POA: Diagnosis not present

## 2020-08-08 DIAGNOSIS — M79671 Pain in right foot: Secondary | ICD-10-CM | POA: Diagnosis not present

## 2020-08-08 DIAGNOSIS — M79672 Pain in left foot: Secondary | ICD-10-CM | POA: Diagnosis not present

## 2020-08-13 DIAGNOSIS — M5459 Other low back pain: Secondary | ICD-10-CM | POA: Diagnosis not present

## 2020-08-31 DIAGNOSIS — G4700 Insomnia, unspecified: Secondary | ICD-10-CM | POA: Diagnosis not present

## 2020-11-08 ENCOUNTER — Ambulatory Visit: Payer: BC Managed Care – PPO | Admitting: Adult Health

## 2020-12-27 DIAGNOSIS — Z8616 Personal history of COVID-19: Secondary | ICD-10-CM | POA: Diagnosis not present

## 2020-12-27 DIAGNOSIS — J189 Pneumonia, unspecified organism: Secondary | ICD-10-CM | POA: Diagnosis not present

## 2021-01-01 DIAGNOSIS — R059 Cough, unspecified: Secondary | ICD-10-CM | POA: Diagnosis not present

## 2021-01-01 DIAGNOSIS — U071 COVID-19: Secondary | ICD-10-CM | POA: Diagnosis not present

## 2021-02-04 DIAGNOSIS — M25552 Pain in left hip: Secondary | ICD-10-CM | POA: Diagnosis not present

## 2021-02-04 DIAGNOSIS — M545 Low back pain, unspecified: Secondary | ICD-10-CM | POA: Diagnosis not present

## 2021-02-05 DIAGNOSIS — Z125 Encounter for screening for malignant neoplasm of prostate: Secondary | ICD-10-CM | POA: Diagnosis not present

## 2021-02-05 DIAGNOSIS — Z0000 Encounter for general adult medical examination without abnormal findings: Secondary | ICD-10-CM | POA: Diagnosis not present

## 2021-02-11 DIAGNOSIS — M545 Low back pain, unspecified: Secondary | ICD-10-CM | POA: Diagnosis not present

## 2021-02-11 DIAGNOSIS — Z6841 Body Mass Index (BMI) 40.0 and over, adult: Secondary | ICD-10-CM | POA: Diagnosis not present

## 2021-02-25 DIAGNOSIS — Z0000 Encounter for general adult medical examination without abnormal findings: Secondary | ICD-10-CM | POA: Diagnosis not present

## 2021-02-25 DIAGNOSIS — Z1331 Encounter for screening for depression: Secondary | ICD-10-CM | POA: Diagnosis not present

## 2021-02-25 DIAGNOSIS — M25552 Pain in left hip: Secondary | ICD-10-CM | POA: Diagnosis not present

## 2021-02-25 DIAGNOSIS — Z1339 Encounter for screening examination for other mental health and behavioral disorders: Secondary | ICD-10-CM | POA: Diagnosis not present

## 2021-03-18 DIAGNOSIS — R051 Acute cough: Secondary | ICD-10-CM | POA: Diagnosis not present

## 2021-03-18 DIAGNOSIS — G4733 Obstructive sleep apnea (adult) (pediatric): Secondary | ICD-10-CM | POA: Diagnosis not present

## 2021-03-18 DIAGNOSIS — R0981 Nasal congestion: Secondary | ICD-10-CM | POA: Diagnosis not present

## 2021-05-01 DIAGNOSIS — H6983 Other specified disorders of Eustachian tube, bilateral: Secondary | ICD-10-CM | POA: Diagnosis not present

## 2021-05-01 DIAGNOSIS — G4733 Obstructive sleep apnea (adult) (pediatric): Secondary | ICD-10-CM | POA: Diagnosis not present

## 2021-07-24 DIAGNOSIS — G4733 Obstructive sleep apnea (adult) (pediatric): Secondary | ICD-10-CM | POA: Diagnosis not present

## 2021-08-24 DIAGNOSIS — G4733 Obstructive sleep apnea (adult) (pediatric): Secondary | ICD-10-CM | POA: Diagnosis not present

## 2021-09-04 DIAGNOSIS — S39012A Strain of muscle, fascia and tendon of lower back, initial encounter: Secondary | ICD-10-CM | POA: Diagnosis not present

## 2021-09-04 DIAGNOSIS — Z79899 Other long term (current) drug therapy: Secondary | ICD-10-CM | POA: Diagnosis not present

## 2021-09-04 DIAGNOSIS — M25552 Pain in left hip: Secondary | ICD-10-CM | POA: Diagnosis not present

## 2021-09-09 DIAGNOSIS — R0981 Nasal congestion: Secondary | ICD-10-CM | POA: Diagnosis not present

## 2021-09-09 DIAGNOSIS — J069 Acute upper respiratory infection, unspecified: Secondary | ICD-10-CM | POA: Diagnosis not present

## 2021-09-18 DIAGNOSIS — S39012A Strain of muscle, fascia and tendon of lower back, initial encounter: Secondary | ICD-10-CM | POA: Diagnosis not present

## 2021-09-18 DIAGNOSIS — Z79899 Other long term (current) drug therapy: Secondary | ICD-10-CM | POA: Diagnosis not present

## 2021-09-19 DIAGNOSIS — M6281 Muscle weakness (generalized): Secondary | ICD-10-CM | POA: Diagnosis not present

## 2021-09-19 DIAGNOSIS — Z9181 History of falling: Secondary | ICD-10-CM | POA: Diagnosis not present

## 2021-09-19 DIAGNOSIS — S39012D Strain of muscle, fascia and tendon of lower back, subsequent encounter: Secondary | ICD-10-CM | POA: Diagnosis not present

## 2021-09-21 DIAGNOSIS — G4733 Obstructive sleep apnea (adult) (pediatric): Secondary | ICD-10-CM | POA: Diagnosis not present

## 2021-09-26 DIAGNOSIS — Z9181 History of falling: Secondary | ICD-10-CM | POA: Diagnosis not present

## 2021-09-26 DIAGNOSIS — S39012D Strain of muscle, fascia and tendon of lower back, subsequent encounter: Secondary | ICD-10-CM | POA: Diagnosis not present

## 2021-09-26 DIAGNOSIS — M6281 Muscle weakness (generalized): Secondary | ICD-10-CM | POA: Diagnosis not present

## 2021-09-27 IMAGING — US US ABDOMEN LIMITED
1 series · 14 of 21 positions shown · non-contrast
Comparison: CT abdomen pelvis dated August 13, 2016.

CLINICAL DATA: Right upper quadrant pain.

EXAM:
ULTRASOUND ABDOMEN LIMITED RIGHT UPPER QUADRANT

[Series 1: us abdomen limited · 14 of 21 slices shown]
[im 1/21]
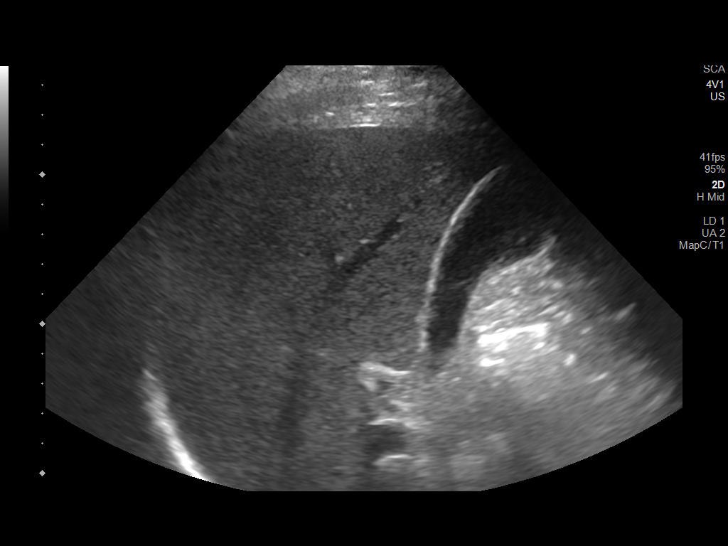
[im 3/21]
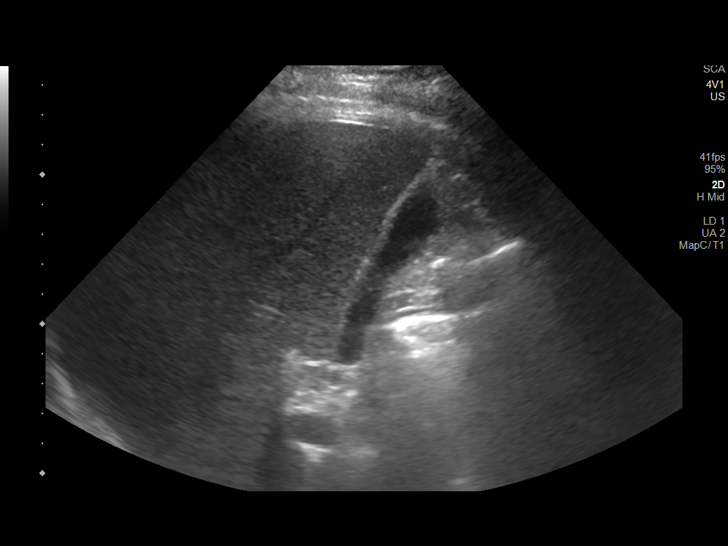
[im 4/21]
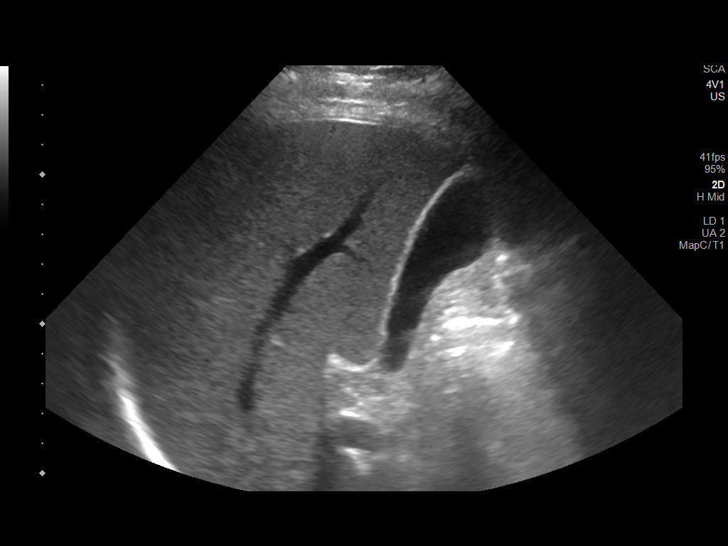
[im 6/21]
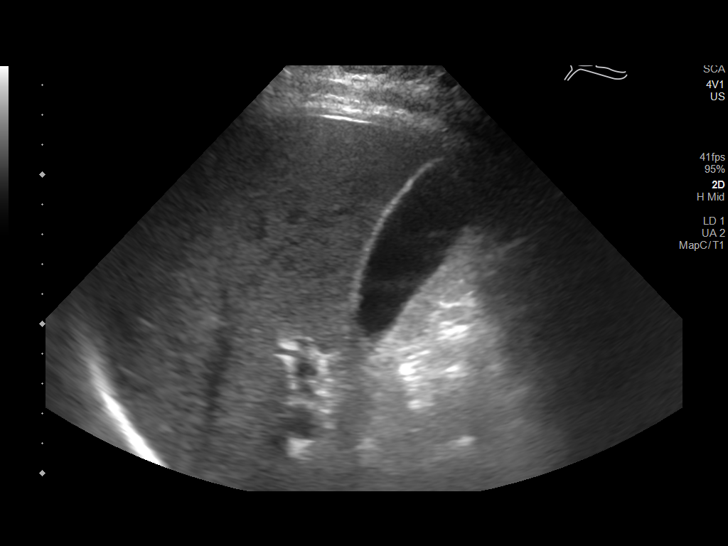
[im 7/21]
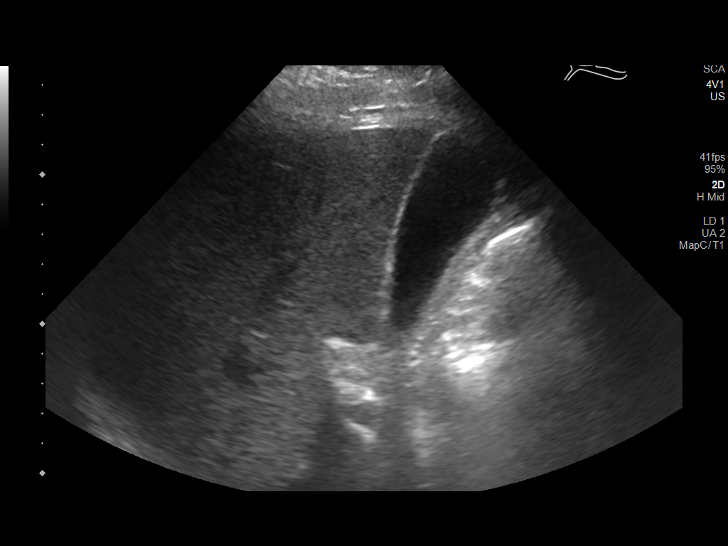
[im 9/21]
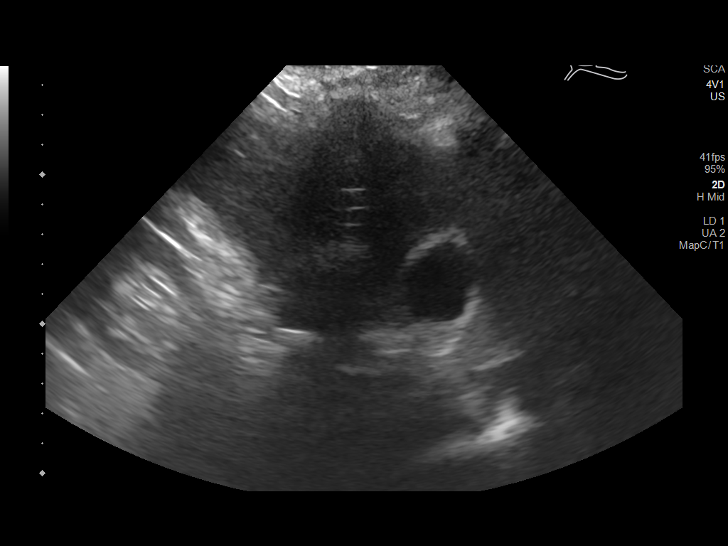
[im 10/21]
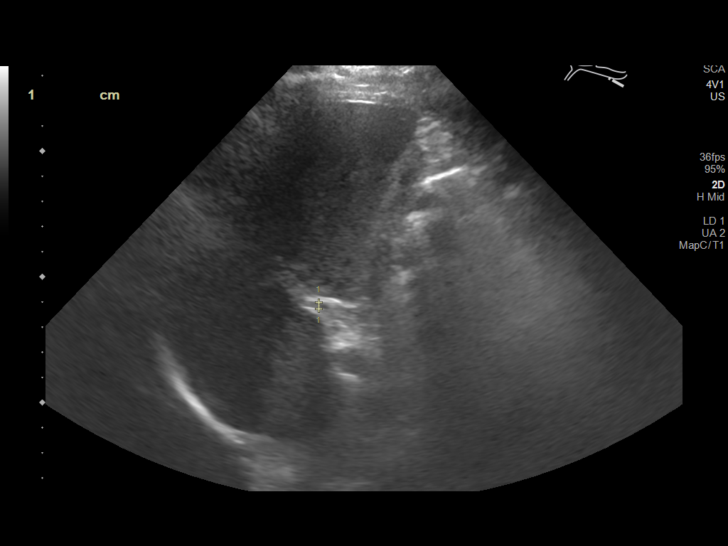
[im 12/21]
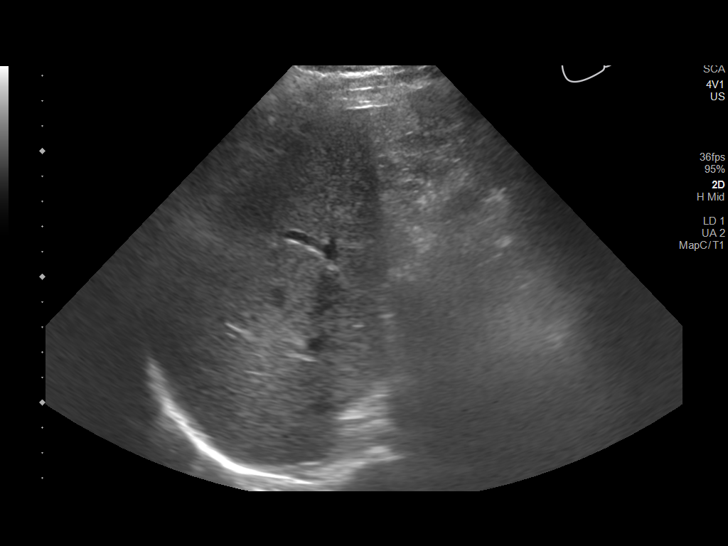
[im 13/21]
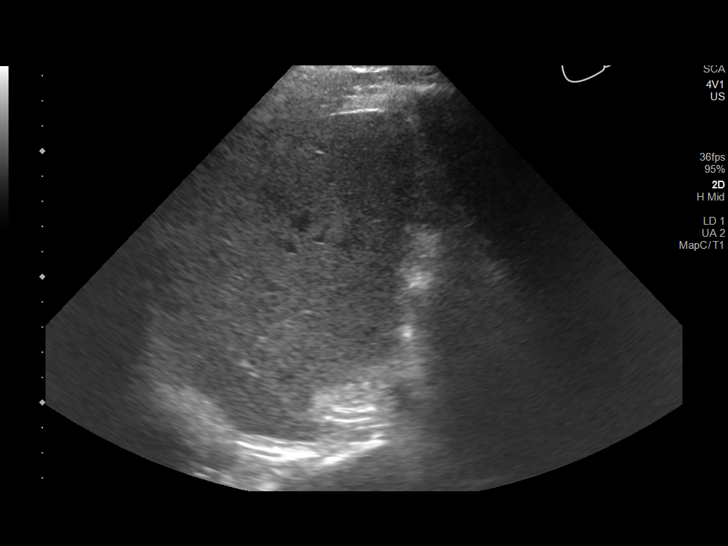
[im 15/21]
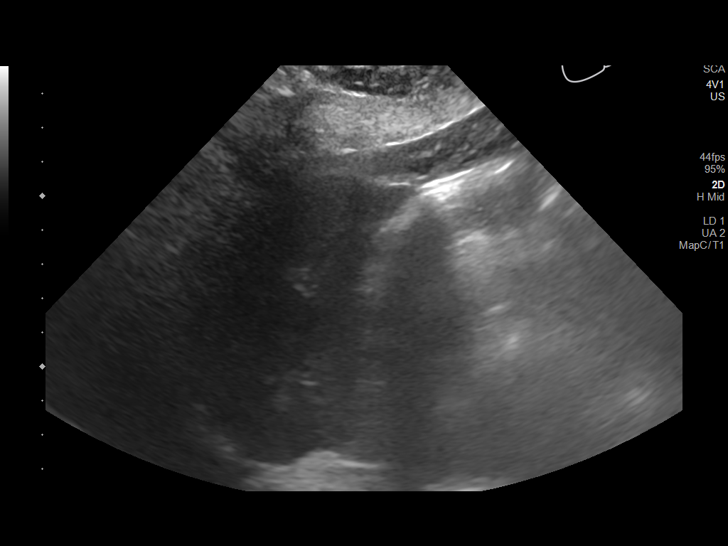
[im 16/21]
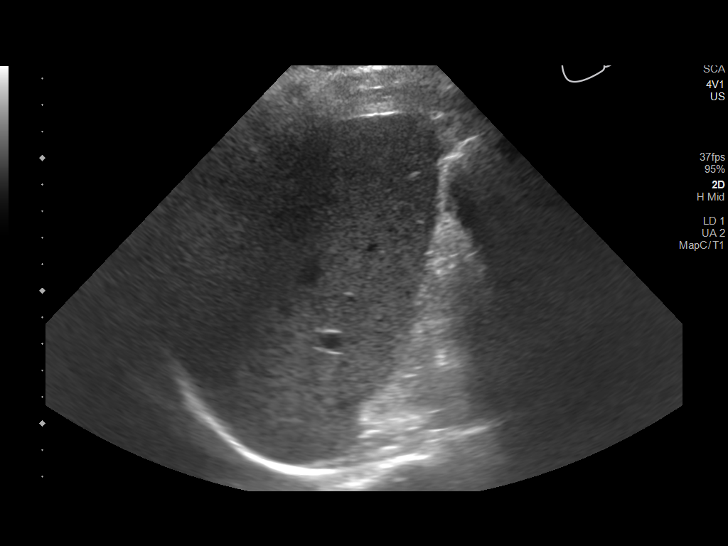
[im 18/21]
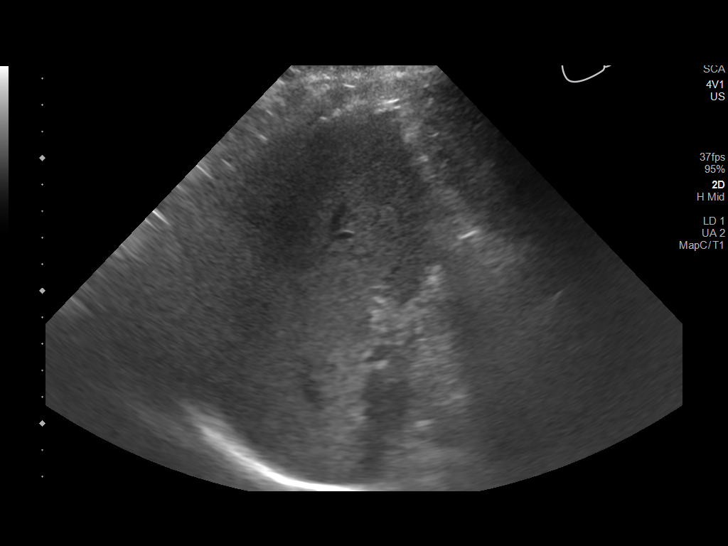
[im 19/21]
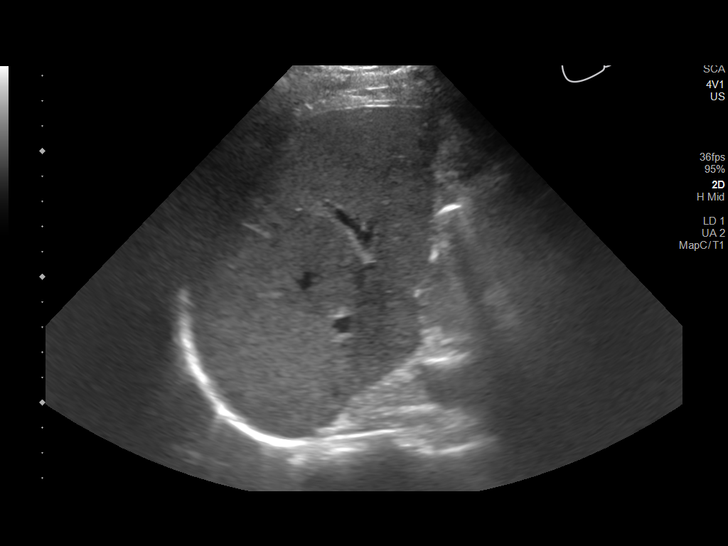
[im 21/21]
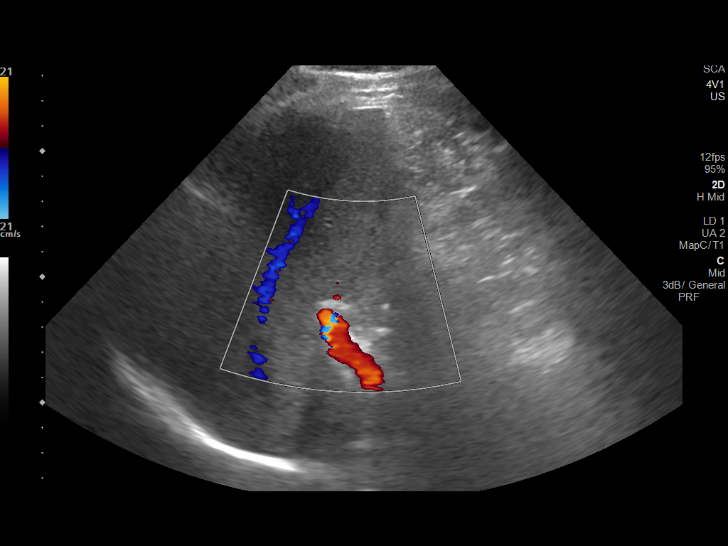

[14 of 21 positions shown; findings below may reference images not displayed]

FINDINGS: Gallbladder:

No gallstones or wall thickening visualized. No sonographic Murphy
sign noted by sonographer.

Common bile duct:

Diameter: 3 mm, normal.

Liver:

No focal lesion identified. Mildly increased in parenchymal
echogenicity. Portal vein is patent on color Doppler imaging with
normal direction of blood flow towards the liver.

Other: None.
IMPRESSION: 1. No acute abnormality.
2. Hepatic steatosis.

## 2021-09-27 IMAGING — CT CT ABD-PELV W/ CM
2 of 5 series · 16 of 46 positions shown, 18 images · IV contrast (APPLIED)
Comparison: Ultrasound 05/12/2019, CT 08/13/2016

CLINICAL DATA: Abdominal pain, near syncopal episode

EXAM:
CT ABDOMEN AND PELVIS WITH CONTRAST
TECHNIQUE: Multidetector CT imaging of the abdomen and pelvis was performed
using the standard protocol following bolus administration of
intravenous contrast.
CONTRAST:  100mL OMNIPAQUE IOHEXOL 300 MG/ML  SOLN

[Series 3: abdomen 5.0 · axial · 0.96mm/px · z∈[+846,+1276]mm · 13 of 102 slices shown, 15 images]
[im 8/102  soft-tissue]
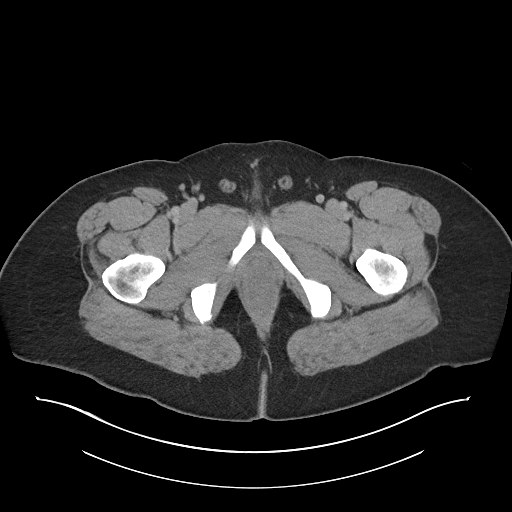
[im 8/102  bone]
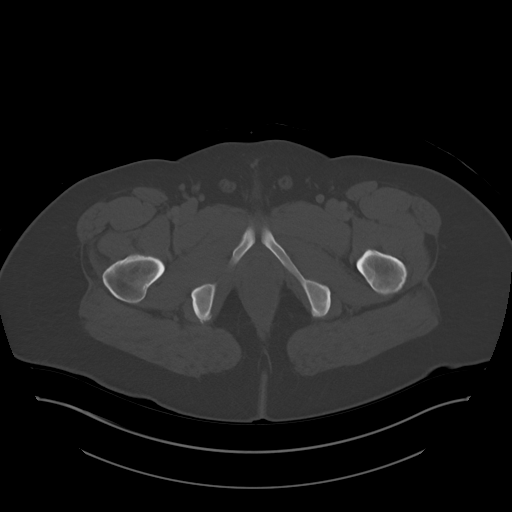
[im 15/102  soft-tissue]
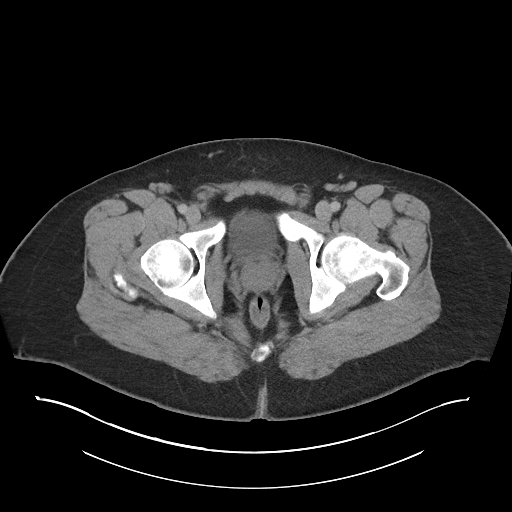
[im 22/102  soft-tissue]
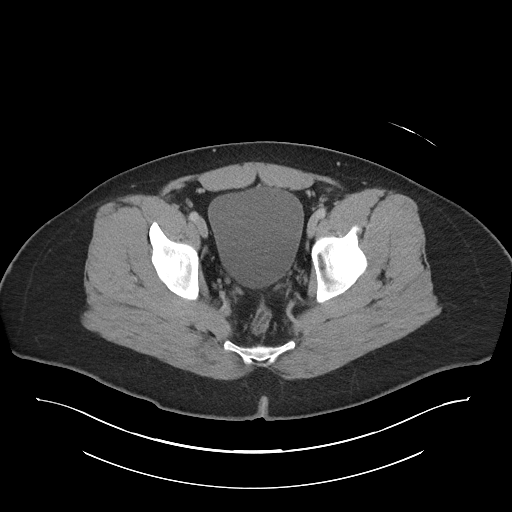
[im 29/102  soft-tissue]
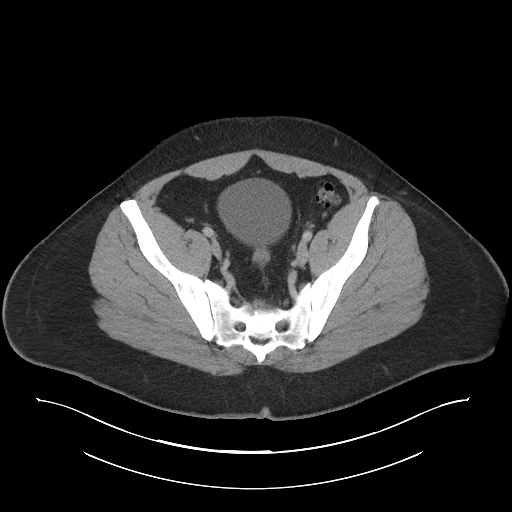
[im 37/102  soft-tissue]
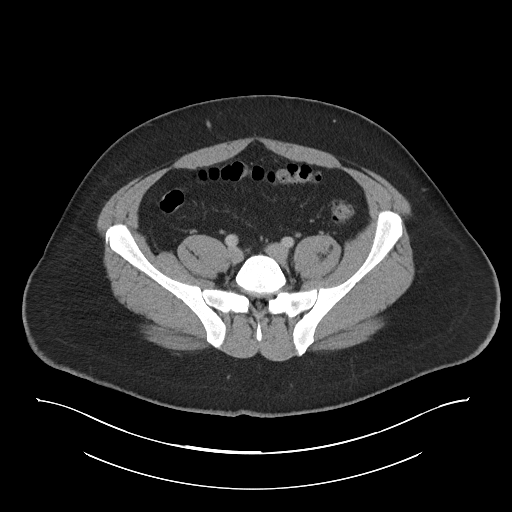
[im 44/102  soft-tissue]
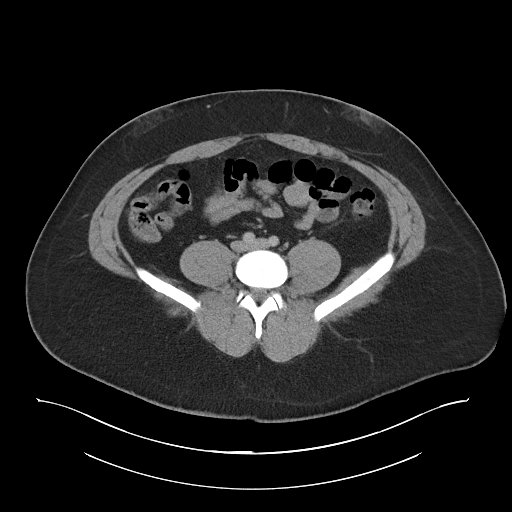
[im 51/102  soft-tissue]
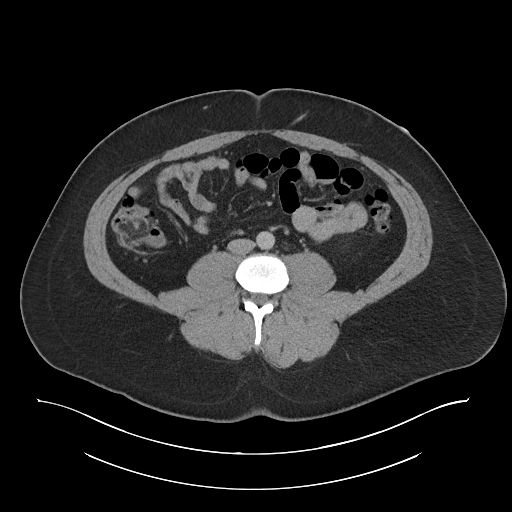
[im 58/102  soft-tissue]
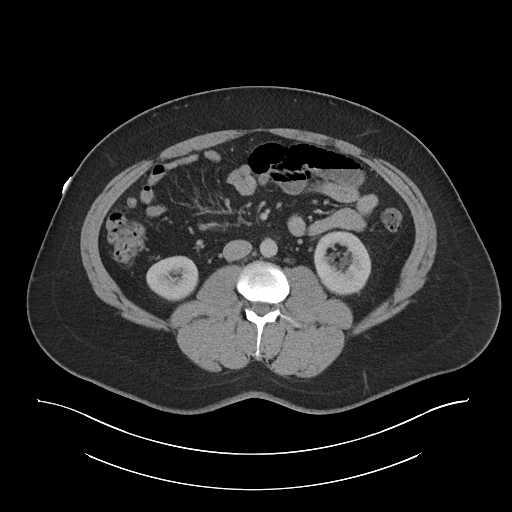
[im 65/102  soft-tissue]
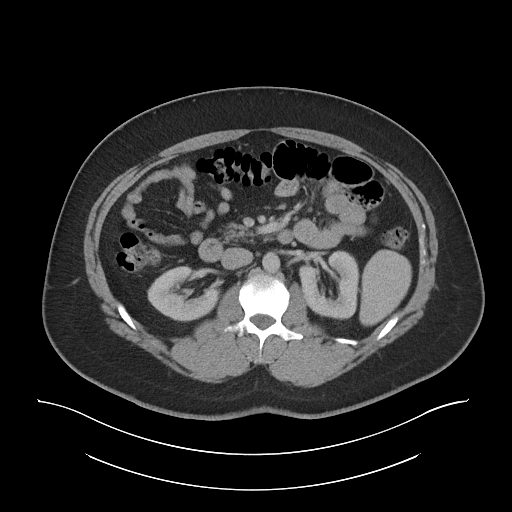
[im 65/102  bone]
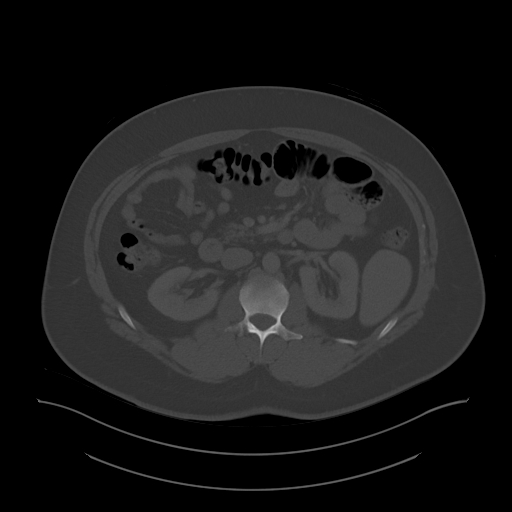
[im 73/102  soft-tissue]
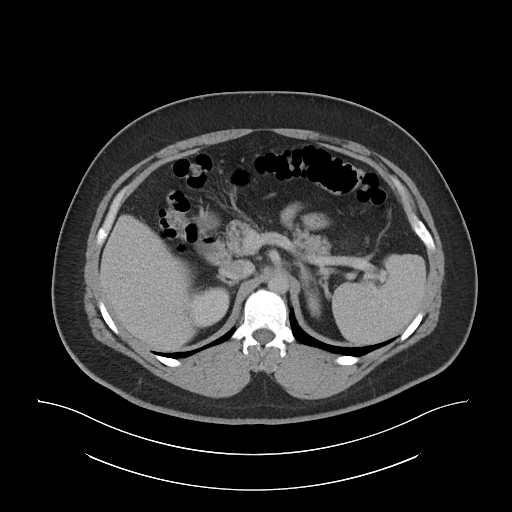
[im 80/102  soft-tissue]
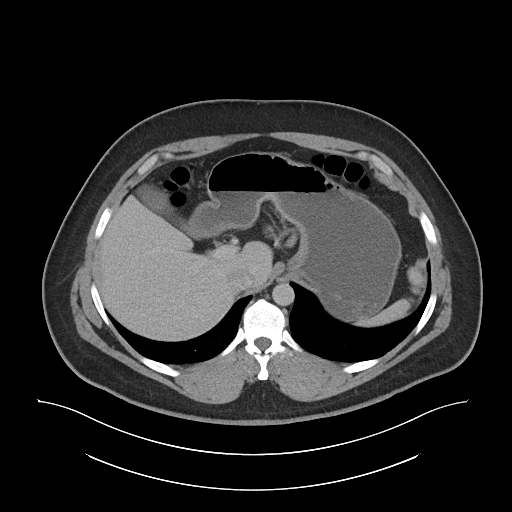
[im 87/102  soft-tissue]
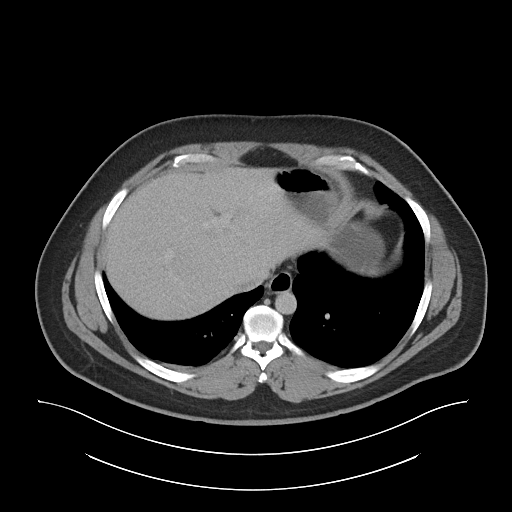
[im 94/102  soft-tissue]
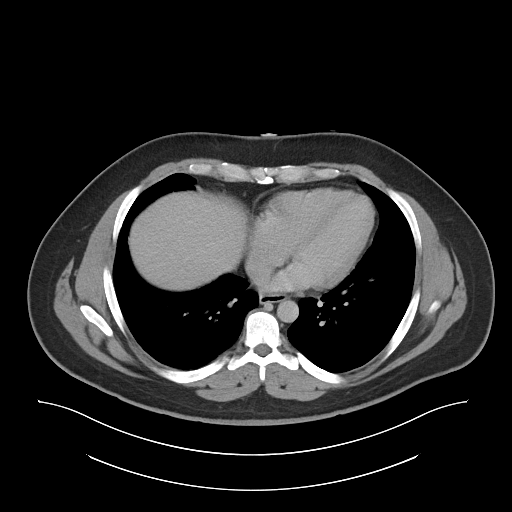

[Series 6: abdomen 3.0 mpr cor · coronal · 1.00mm/px · 3 of 127 slices shown]
[im 43/127  soft-tissue]
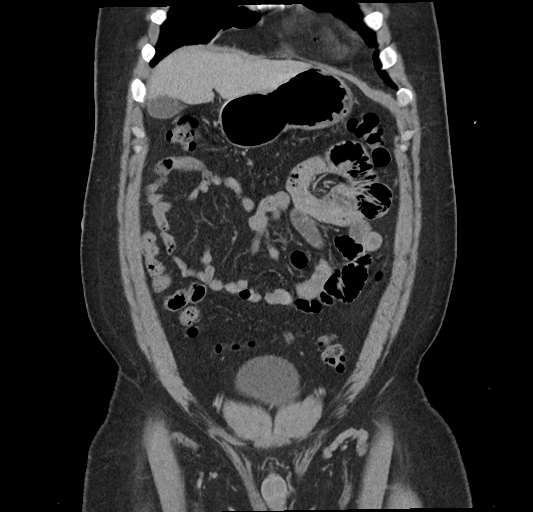
[im 57/127  soft-tissue]
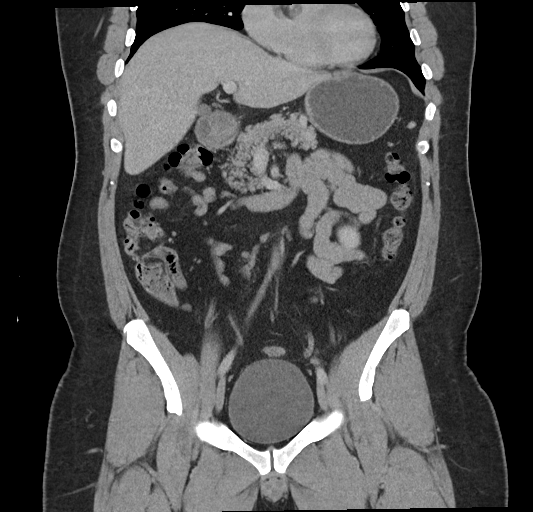
[im 71/127  soft-tissue]
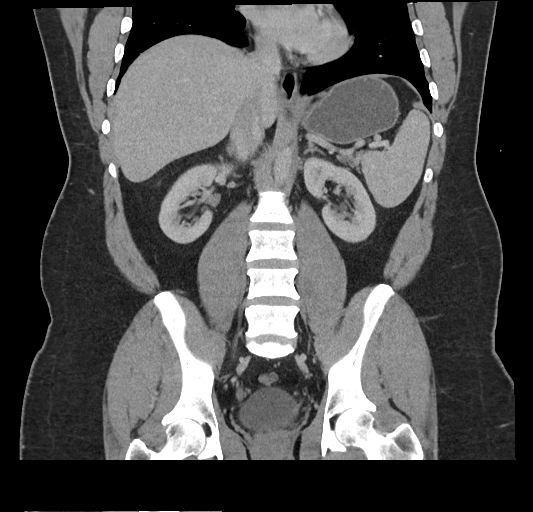

[16 of 46 positions shown; findings below may reference images not displayed]

FINDINGS: Lower chest: No acute abnormality.

Hepatobiliary: No focal liver abnormality is seen. No gallstones,
gallbladder wall thickening, or biliary dilatation.

Pancreas: Unremarkable. No pancreatic ductal dilatation or
surrounding inflammatory changes.

Spleen: Normal in size without focal abnormality.

Adrenals/Urinary Tract: Adrenal glands are unremarkable. Kidneys are
normal, without renal calculi, focal lesion, or hydronephrosis.
Bladder is unremarkable.

Stomach/Bowel: Stomach is within normal limits. Appendix appears
normal. No evidence of bowel wall thickening, distention, or
inflammatory changes.

Vascular/Lymphatic: No significant vascular findings are present. No
enlarged abdominal or pelvic lymph nodes.

Reproductive: Prostate is unremarkable.

Other: No abdominal wall hernia or abnormality. No abdominopelvic
ascites.

Musculoskeletal: No acute or significant osseous findings.
IMPRESSION: Negative. No CT evidence for acute intra-abdominal or pelvic
abnormality

## 2021-10-09 DIAGNOSIS — Z79899 Other long term (current) drug therapy: Secondary | ICD-10-CM | POA: Diagnosis not present

## 2021-10-09 DIAGNOSIS — Z6841 Body Mass Index (BMI) 40.0 and over, adult: Secondary | ICD-10-CM | POA: Diagnosis not present

## 2021-10-09 DIAGNOSIS — S39012A Strain of muscle, fascia and tendon of lower back, initial encounter: Secondary | ICD-10-CM | POA: Diagnosis not present

## 2021-10-23 ENCOUNTER — Telehealth: Payer: Self-pay | Admitting: Adult Health

## 2021-10-23 NOTE — Telephone Encounter (Signed)
Pt claims [place] advised him to speak with [facility] for approval for new headgear on CPAP machine.  ?Airtouch F-20 mask, which pt is getting fitted for, requires new headgear. ?Pt would like to know if an appointment is needed to do this. Pt would like a call back  ?[phone] ?

## 2021-10-23 NOTE — Telephone Encounter (Signed)
Pt last seen November 2022. Appt needed. Usage data available on online. Pt states he hasn't been able to use it because of leaking mask. He wants new mask. Started using more consistently April 7th. Pt has been scheduled for next available appt on 4/27 at 9:45 AM with Dr Rexene Alberts. Pt verbalized appreciation for the call.  ?

## 2021-10-31 ENCOUNTER — Ambulatory Visit: Payer: BC Managed Care – PPO | Admitting: Neurology

## 2021-10-31 ENCOUNTER — Encounter: Payer: Self-pay | Admitting: Neurology

## 2021-10-31 VITALS — BP 123/75 | HR 85 | Ht 69.0 in | Wt 302.0 lb

## 2021-10-31 DIAGNOSIS — Z789 Other specified health status: Secondary | ICD-10-CM | POA: Diagnosis not present

## 2021-10-31 DIAGNOSIS — R635 Abnormal weight gain: Secondary | ICD-10-CM | POA: Diagnosis not present

## 2021-10-31 DIAGNOSIS — Z9181 History of falling: Secondary | ICD-10-CM | POA: Diagnosis not present

## 2021-10-31 DIAGNOSIS — Z9989 Dependence on other enabling machines and devices: Secondary | ICD-10-CM | POA: Diagnosis not present

## 2021-10-31 DIAGNOSIS — S39012D Strain of muscle, fascia and tendon of lower back, subsequent encounter: Secondary | ICD-10-CM | POA: Diagnosis not present

## 2021-10-31 DIAGNOSIS — G4733 Obstructive sleep apnea (adult) (pediatric): Secondary | ICD-10-CM | POA: Diagnosis not present

## 2021-10-31 DIAGNOSIS — M6281 Muscle weakness (generalized): Secondary | ICD-10-CM | POA: Diagnosis not present

## 2021-10-31 NOTE — Patient Instructions (Signed)
It was nice to see you again today! ?Please continue using your autoPAP regularly. While your insurance requires that you use PAP at least 4 hours each night on 70% of the nights, I recommend, that you not skip any nights and use it throughout the night if you can. Getting used to PAP and staying with the treatment long term does take time and patience and discipline. Untreated obstructive sleep apnea when it is moderate to severe can have an adverse impact on cardiovascular health and raise her risk for heart disease, arrhythmias, hypertension, congestive heart failure, stroke and diabetes. Untreated obstructive sleep apnea causes sleep disruption, nonrestorative sleep, and sleep deprivation. This can have an impact on your day to day functioning and cause daytime sleepiness and impairment of cognitive function, memory loss, mood disturbance, and problems focussing. Using PAP regularly can improve these symptoms. ? ?Try the Amara Full mask and let us know if you like it. You may need a medium size, not Large. ? ?I will write for supplies, call [employer] for your supplies, and you can certainly try the AirTouch FFM from [employer].  ?

## 2021-10-31 NOTE — Progress Notes (Signed)
Subjective:  ?  ?Patient ID: James Webster is a 46 y.o. male. ? ?HPI ? ? ? ?Interim history:  ? ?James Webster is a 46 year old right-handed gentleman with presents for follow-up consultation of his obstructive sleep apnea, on AutoPap therapy.  I last saw him on 01/26/2017, at which time he was on CPAP of 11.  He was compliant with treatment. ? ?He saw Ward Givens, nurse practitioner, in the interim on 08/03/2017, at which time he was suboptimal with his CPAP compliance.  He had lost about 25 pounds.  ? ?He saw Ward Givens, NP on 03/30/2018, at which time he was not fully compliant with his AutoPap of 5 to 15 cm. A repeat sleep test was discussed. ? ? ?He had a home sleep test on 05/20/2018, which indicated an AHI of 15.1/h, O2 nadir 88%.  His BMI was 34.9 at the time.  He was encouraged to continue with positive airway pressure treatment with a lower pressure setting. ? ?He saw Ward Givens, NP on 09/08/19, at which time he was on AutoPap of 7 to 11 cm with adequate compliance.  ? ?He saw Megan on 05/10/20, at which time he was not fully compliant with his AutoPap.  He was advised to talk to his dentist about an oral appliance. ? ? ?Today, 10/31/21 (all dictated new, as well as above notes, some dictation done in note pad or Word, outside of chart, may appear as copied):  ? ?I reviewed his autoPAP compliance data from 09/30/2021 through 10/29/2021, which is a total of 30 days, during which time he used his machine for 15 days with percent use days greater than 4 hours at 47%, indicating suboptimal compliance, average usage of 5 hours and 52 minutes for days on treatment, residual AHI elevated at 8.5/h, 95th percentile pressure at 9.2 cm with a range of 7 to 11 cm, leak on the higher side consistently with the 95th percentile at 47.5 L/min.  He reports not using his machine as he needs new supplies.  He is working on weight loss.  He reports that he has tried a bite guard but never really had a consultation  formally for an oral appliance.  He suspects that he would not be able to tolerate an oral appliance and even the bite guard did not do so well.  He is motivated to continue with his AutoPap but needs new supplies.  He is having a hard time finding a good mask.  He is currently using a under the nose style fullface mask but would really like to try the AirTouch from [place].  We were going back and forth a little bit in terms of options and I suggested eventually that he try a sample for a [employer] Amara fullface mask.  He was also offered an N20 and F20 from [employer] but he recalls that he tried these before and did not do well with them.  He is working on weight loss but has had weight gain.  He admits that he went about 8 months without treatment and his wife worried about him because he had apneas.  He restarted about a month ago. ? ?He had ENT evaluation for ear fullness with Dr. Hassell Done through [facility].  I reviewed the office note from 05/01/2021.  Alternative treatment options for sleep apnea including inspire were discussed.  Patient was encouraged to continue to work on weight loss. ?  ?The patient's allergies, current medications, family history, past medical history, past social  history, past surgical history and problem list were reviewed and updated as appropriate.  ?  ?Previously (copied from previous notes for reference): ?  ?I reviewed his CPAP compliance data from 12/23/2016 through 01/21/2017, which is a total of 30 days, during which time he used his CPAP 28 days with percent used days greater than 4 hours at 90%, indicating excellent compliance with an average usage of 6 hours and 16 minutes, residual AHI at 2.3 per hour, leak on the higher side with the 95th percentile at 21.2 L/m on a pressure of 11 cm with EPR of 3. ? ?I first met him on 09/09/2016 at the request of his primary care physician, at which time he reported snoring and excessive daytime somnolence as well as  witnessed apneas. The patient was invited for sleep study testing. He had a baseline sleep study, followed by a CPAP titration study. I went over his test results with him in detail today. His baseline sleep study performed 10/05/2016 showed a sleep efficiency of 95.6%, sleep latency of 12 minutes, REM latency mildly prolonged at 150.5 minutes, he had an increased percentage of stage II sleep, slow-wave sleep was adequate at 14.6%, REM sleep was 20.3%. He had a total AHI of 22.7 per hour, REM AHI of 66 per hour, supine AHI was 22.7 per hour. His average oxygen saturation was 94%, nadir was 79%. He had no significant PLMS or EKG changes or EEG changes. Based on his test results I invited him for a full night CPAP titration study. He had this on 11/07/2016. Sleep efficiency was 67.3% only, sleep latency was 79.5 minutes  and REM latency was 67.5 minutes. He had an increased percentage of stage I sleep, slow-wave sleep was 13.7% and REM sleep was 17.6%. CPAP was titrated via full facemask which was patient preferred. CPAP was titrated from 5 cm to 13 cm. On a pressure of 12 cm his AHI was 12.5 per hour, O2 nadir of 89% with nonsupine REM sleep achieved. Based on his test results are prescribed CPAP therapy at 13 cm for home use.  ?  ?He called in the interim in June 2018 requesting a pressure reduction as it felt too high for him. I reduced his CPAP pressure to 11 cm. ?  ?  ?  ?09/09/2016: (He) reports snoring, witnessed apneic breathing pauses while asleep and morning headaches as well as daytime somnolence. I reviewed your office note from 07/30/2016 as well as phone note from 08/14/2016. His Epworth sleepiness score is 9 out of 24, his fatigue score is 48 out of 63. ?He is married and lives with his wife, they have no children, 1 cat. They watch TV in bed and put the TV on a timer for 90 minutes or 120 minutes typically. 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 He works from 1 PM to 10 PM, bedtime is  therefore late, between midnight and 3 AM typically. He does not have trouble going to sleep but has sleep disruption and wakes up with a sense of gasping for air, has witnessed apneas per wife, has occasional morning headaches, sometimes he has to take ibuprofen for his headaches, no history of migraines. He is not aware of any family history of OSA, denies restless leg symptoms or leg twitching at night, sometimes has woken up with palpitations. He has no night to night nocturia. He quit smoking in 1997, drinks alcohol about twice a week, drinks caffeine in the form of coffee, about  6 cups per day, has occasional vivid dreams but typically no nightmares. He moved to [place] in November 2016. 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 He has always been active and physically quite fit and athletic. He has been gaining weight since he has taken up a sedentary job. This is the highest weight he has ever been. He has a history of right knee injury which does limit his mobility. He also has low back pain, had MRI testing, saw orthopedics, has received epidural steroid injections and also saw pain management doctor. He takes Norco very occasionally, has a prescription from 2016. He takes Flexeril occasionally as well. He has been a poor sleeper for years, he has been snoring loudly for years. He does not remember the last time he felt really well about his sleep. ?  ? ? ?His Past Medical History Is Significant For: ?Past Medical History:  ?Diagnosis Date  ? DDD (degenerative disc disease), lumbosacral 11/08/2015  ? OSA on CPAP   ? ? ?His Past Surgical History Is Significant For: ?Past Surgical History:  ?Procedure Laterality Date  ? none    ? ? ?His Family History Is Significant For: ?Family History  ?Problem Relation Age of Onset  ? Arthritis Mother   ? Gallbladder disease Mother   ? Healthy Father   ? Gallbladder disease Sister   ? Heart disease Maternal Grandfather    ? Cancer Maternal Aunt   ? Colon cancer Neg Hx   ? Stomach cancer Neg Hx   ? Rectal cancer Neg Hx   ? Esophageal cancer Neg Hx   ? Liver cancer Neg Hx   ? ? ?His Social History Is Significant For: ?Social History

## 2021-11-04 DIAGNOSIS — G4733 Obstructive sleep apnea (adult) (pediatric): Secondary | ICD-10-CM | POA: Diagnosis not present

## 2021-11-04 NOTE — Telephone Encounter (Signed)
Denyse Amass, RN; Vanessa Ralphs ?got it!   ? ?  ?Previous Messages ?  ?----- Message -----  ?From: Brandon Melnick, RN  ?Sent: 10/31/2021  10:49 AM EDT  ?To: Ocie Bob  ? ?Good morning, new order in Epic for PAP supplies.  Thank you Newman Pies  ? ?Rudie Sermons  ?Male, 46 y.o., 29-May-1976  ?MRN:  ?[id number]   ? ?

## 2021-11-05 DIAGNOSIS — S39012D Strain of muscle, fascia and tendon of lower back, subsequent encounter: Secondary | ICD-10-CM | POA: Diagnosis not present

## 2021-11-05 DIAGNOSIS — Z9181 History of falling: Secondary | ICD-10-CM | POA: Diagnosis not present

## 2021-11-05 DIAGNOSIS — M6281 Muscle weakness (generalized): Secondary | ICD-10-CM | POA: Diagnosis not present

## 2021-11-06 DIAGNOSIS — G4733 Obstructive sleep apnea (adult) (pediatric): Secondary | ICD-10-CM | POA: Diagnosis not present

## 2021-11-07 DIAGNOSIS — S39012D Strain of muscle, fascia and tendon of lower back, subsequent encounter: Secondary | ICD-10-CM | POA: Diagnosis not present

## 2021-11-07 DIAGNOSIS — Z9181 History of falling: Secondary | ICD-10-CM | POA: Diagnosis not present

## 2021-11-07 DIAGNOSIS — M6281 Muscle weakness (generalized): Secondary | ICD-10-CM | POA: Diagnosis not present

## 2021-11-11 DIAGNOSIS — S39012D Strain of muscle, fascia and tendon of lower back, subsequent encounter: Secondary | ICD-10-CM | POA: Diagnosis not present

## 2021-11-11 DIAGNOSIS — M6281 Muscle weakness (generalized): Secondary | ICD-10-CM | POA: Diagnosis not present

## 2021-11-11 DIAGNOSIS — Z9181 History of falling: Secondary | ICD-10-CM | POA: Diagnosis not present

## 2021-11-14 DIAGNOSIS — M6281 Muscle weakness (generalized): Secondary | ICD-10-CM | POA: Diagnosis not present

## 2021-11-14 DIAGNOSIS — S39012D Strain of muscle, fascia and tendon of lower back, subsequent encounter: Secondary | ICD-10-CM | POA: Diagnosis not present

## 2021-11-14 DIAGNOSIS — Z9181 History of falling: Secondary | ICD-10-CM | POA: Diagnosis not present

## 2021-11-19 DIAGNOSIS — M6281 Muscle weakness (generalized): Secondary | ICD-10-CM | POA: Diagnosis not present

## 2021-11-19 DIAGNOSIS — M5416 Radiculopathy, lumbar region: Secondary | ICD-10-CM | POA: Diagnosis not present

## 2021-11-19 DIAGNOSIS — S39012D Strain of muscle, fascia and tendon of lower back, subsequent encounter: Secondary | ICD-10-CM | POA: Diagnosis not present

## 2021-11-19 DIAGNOSIS — Z9181 History of falling: Secondary | ICD-10-CM | POA: Diagnosis not present

## 2021-12-07 DIAGNOSIS — G4733 Obstructive sleep apnea (adult) (pediatric): Secondary | ICD-10-CM | POA: Diagnosis not present

## 2021-12-09 DIAGNOSIS — Z6841 Body Mass Index (BMI) 40.0 and over, adult: Secondary | ICD-10-CM | POA: Diagnosis not present

## 2021-12-09 DIAGNOSIS — Z79899 Other long term (current) drug therapy: Secondary | ICD-10-CM | POA: Diagnosis not present

## 2021-12-09 DIAGNOSIS — S39012A Strain of muscle, fascia and tendon of lower back, initial encounter: Secondary | ICD-10-CM | POA: Diagnosis not present

## 2022-01-06 DIAGNOSIS — G4733 Obstructive sleep apnea (adult) (pediatric): Secondary | ICD-10-CM | POA: Diagnosis not present

## 2022-02-04 DIAGNOSIS — G4733 Obstructive sleep apnea (adult) (pediatric): Secondary | ICD-10-CM | POA: Diagnosis not present

## 2022-02-11 DIAGNOSIS — G4733 Obstructive sleep apnea (adult) (pediatric): Secondary | ICD-10-CM | POA: Diagnosis not present

## 2022-03-06 DIAGNOSIS — M9903 Segmental and somatic dysfunction of lumbar region: Secondary | ICD-10-CM | POA: Diagnosis not present

## 2022-03-06 DIAGNOSIS — M9902 Segmental and somatic dysfunction of thoracic region: Secondary | ICD-10-CM | POA: Diagnosis not present

## 2022-03-06 DIAGNOSIS — M9901 Segmental and somatic dysfunction of cervical region: Secondary | ICD-10-CM | POA: Diagnosis not present

## 2022-03-06 DIAGNOSIS — M9905 Segmental and somatic dysfunction of pelvic region: Secondary | ICD-10-CM | POA: Diagnosis not present

## 2022-03-07 DIAGNOSIS — G4733 Obstructive sleep apnea (adult) (pediatric): Secondary | ICD-10-CM | POA: Diagnosis not present

## 2022-03-11 DIAGNOSIS — M9902 Segmental and somatic dysfunction of thoracic region: Secondary | ICD-10-CM | POA: Diagnosis not present

## 2022-03-11 DIAGNOSIS — Z79899 Other long term (current) drug therapy: Secondary | ICD-10-CM | POA: Diagnosis not present

## 2022-03-11 DIAGNOSIS — M9905 Segmental and somatic dysfunction of pelvic region: Secondary | ICD-10-CM | POA: Diagnosis not present

## 2022-03-11 DIAGNOSIS — M9903 Segmental and somatic dysfunction of lumbar region: Secondary | ICD-10-CM | POA: Diagnosis not present

## 2022-03-11 DIAGNOSIS — Z6841 Body Mass Index (BMI) 40.0 and over, adult: Secondary | ICD-10-CM | POA: Diagnosis not present

## 2022-03-11 DIAGNOSIS — M9901 Segmental and somatic dysfunction of cervical region: Secondary | ICD-10-CM | POA: Diagnosis not present

## 2022-03-11 DIAGNOSIS — S39012A Strain of muscle, fascia and tendon of lower back, initial encounter: Secondary | ICD-10-CM | POA: Diagnosis not present

## 2022-03-12 DIAGNOSIS — M9902 Segmental and somatic dysfunction of thoracic region: Secondary | ICD-10-CM | POA: Diagnosis not present

## 2022-03-12 DIAGNOSIS — M9901 Segmental and somatic dysfunction of cervical region: Secondary | ICD-10-CM | POA: Diagnosis not present

## 2022-03-12 DIAGNOSIS — M9903 Segmental and somatic dysfunction of lumbar region: Secondary | ICD-10-CM | POA: Diagnosis not present

## 2022-03-12 DIAGNOSIS — M9905 Segmental and somatic dysfunction of pelvic region: Secondary | ICD-10-CM | POA: Diagnosis not present

## 2022-03-18 DIAGNOSIS — R7989 Other specified abnormal findings of blood chemistry: Secondary | ICD-10-CM | POA: Diagnosis not present

## 2022-03-18 DIAGNOSIS — Z125 Encounter for screening for malignant neoplasm of prostate: Secondary | ICD-10-CM | POA: Diagnosis not present

## 2022-03-18 DIAGNOSIS — R5383 Other fatigue: Secondary | ICD-10-CM | POA: Diagnosis not present

## 2022-05-08 ENCOUNTER — Ambulatory Visit: Payer: BC Managed Care – PPO | Admitting: Adult Health

## 2022-05-27 ENCOUNTER — Ambulatory Visit (AMBULATORY_SURGERY_CENTER): Payer: Self-pay | Admitting: *Deleted

## 2022-05-27 VITALS — Ht 69.0 in | Wt 284.0 lb

## 2022-05-27 DIAGNOSIS — Z1211 Encounter for screening for malignant neoplasm of colon: Secondary | ICD-10-CM

## 2022-05-27 MED ORDER — NA SULFATE-K SULFATE-MG SULF 17.5-3.13-1.6 GM/177ML PO SOLN: 1.0000 | Freq: Once | ORAL | 0 refills | Status: AC

## 2022-05-27 NOTE — Progress Notes (Signed)
No egg or soy allergy known to patient  No issues known to pt with past sedation with any surgeries or procedures Patient denies ever being told they had issues or difficulty with intubation  No FH of Malignant Hyperthermia Pt is  on diet pills Pt is not on  home 02  Pt is not on blood thinners  Pt denies issues with constipation  No A fib or A flutter Have any cardiac testing pending--NO Pt instructed to use Singlecare.[email or web address] or GoodRx for a price reduction on prep    Patient's chart reviewed by Osvaldo Angst CNRA prior to previsit and patient appropriate for the [place].  Previsit completed and red dot placed by patient's name on their procedure day (on provider's schedule).

## 2022-06-09 DIAGNOSIS — M722 Plantar fascial fibromatosis: Secondary | ICD-10-CM | POA: Diagnosis not present

## 2022-06-16 ENCOUNTER — Ambulatory Visit: Payer: BC Managed Care – PPO | Admitting: Podiatry

## 2022-06-16 ENCOUNTER — Encounter: Payer: Self-pay | Admitting: Gastroenterology

## 2022-06-17 ENCOUNTER — Encounter: Payer: BC Managed Care – PPO | Admitting: Gastroenterology

## 2022-06-18 ENCOUNTER — Encounter: Payer: Self-pay | Admitting: Certified Registered Nurse Anesthetist

## 2022-06-19 ENCOUNTER — Ambulatory Visit (AMBULATORY_SURGERY_CENTER): Payer: BC Managed Care – PPO | Admitting: Gastroenterology

## 2022-06-19 ENCOUNTER — Encounter: Payer: Self-pay | Admitting: Gastroenterology

## 2022-06-19 VITALS — BP 118/74 | HR 75 | Temp 98.7°F | Resp 14 | Ht 69.0 in | Wt 284.0 lb

## 2022-06-19 DIAGNOSIS — Z1211 Encounter for screening for malignant neoplasm of colon: Secondary | ICD-10-CM | POA: Diagnosis not present

## 2022-06-19 DIAGNOSIS — K621 Rectal polyp: Secondary | ICD-10-CM

## 2022-06-19 DIAGNOSIS — D128 Benign neoplasm of rectum: Secondary | ICD-10-CM

## 2022-06-19 MED ORDER — SODIUM CHLORIDE 0.9 % IV SOLN: 500.0000 mL | Freq: Once | INTRAVENOUS | Status: AC

## 2022-06-19 NOTE — Patient Instructions (Addendum)
RECOMMENDATIONS: - Patient has a contact number available for emergencies. The signs and symptoms of potential delayed complications were discussed with the patient. Return to normal activities tomorrow. Written discharge instructions were provided to the patient. - Resume previous diet. - Continue present medications. - Await pathology results. - Repeat colonoscopy in 5-10 years for surveillance based on pathology results.    YOU HAD AN ENDOSCOPIC PROCEDURE TODAY AT [facility]:   Refer to the procedure report that was given to you for any specific questions about what was found during the examination.  If the procedure report does not answer your questions, please call your gastroenterologist to clarify.  If you requested that your care partner not be given the details of your procedure findings, then the procedure report has been included in a sealed envelope for you to review at your convenience later.  YOU SHOULD EXPECT: Some feelings of bloating in the abdomen. Passage of more gas than usual.  Walking can help get rid of the air that was put into your GI tract during the procedure and reduce the bloating. If you had a lower endoscopy (such as a colonoscopy or flexible sigmoidoscopy) you may notice spotting of blood in your stool or on the toilet paper. If you underwent a bowel prep for your procedure, you may not have a normal bowel movement for a few days.  Please Note:  You might notice some irritation and congestion in your nose or some drainage.  This is from the oxygen used during your procedure.  There is no need for concern and it should clear up in a day or so.  SYMPTOMS TO REPORT IMMEDIATELY:  Following lower endoscopy (colonoscopy or flexible sigmoidoscopy):  Excessive amounts of blood in the stool  Significant tenderness or worsening of abdominal pains  Swelling of the abdomen that is new, acute  Fever of 100F or higher   For urgent or emergent issues, a  gastroenterologist can be reached at any hour by calling [phone]. Do not use MyChart messaging for urgent concerns.    DIET:  We do recommend a small meal at first, but then you may proceed to your regular diet.  Drink plenty of fluids but you should avoid alcoholic beverages for 24 hours.  MEDICATIONS: Continue present medications.  Please see handouts given to you by your recovery nurse: polyps, hemorrhoids.  Thank you for allowing [place] to provide for your healthcare needs today.  ACTIVITY:  You should plan to take it easy for the rest of today and you should NOT DRIVE or use heavy machinery until tomorrow (because of the sedation medicines used during the test).    FOLLOW UP: Our staff will call the number listed on your records the next business day following your procedure.  We will call around 7:15- 8:00 am to check on you and address any questions or concerns that you may have regarding the information given to you following your procedure. If we do not reach you, we will leave a message.     If any biopsies were taken you will be contacted by phone or by letter within the next 1-3 weeks.  Please call us at [phone] if you have not heard about the biopsies in 3 weeks.    SIGNATURES/CONFIDENTIALITY: You and/or your care partner have signed paperwork which will be entered into your electronic medical record.  These signatures attest to the fact that that the information above on your After Visit Summary has been  reviewed and is understood.  Full responsibility of the confidentiality of this discharge information lies with you and/or your care-partner. 

## 2022-06-19 NOTE — Progress Notes (Signed)
Called to room to assist during endoscopic procedure.  Patient ID and intended procedure confirmed with present staff. Received instructions for my participation in the procedure from the performing physician.  

## 2022-06-19 NOTE — Progress Notes (Signed)
[facility] History and Physical   Primary Care Physician:  Velna Hatchet, MD   Reason for Procedure:  Colorectal cancer screening  Plan:    Screening colonoscopy with possible interventions as needed     HPI: James Webster is a very pleasant 46 y.o. male here for screening colonoscopy. Denies any nausea, vomiting, abdominal pain, melena or bright red blood per rectum  The risks and benefits as well as alternatives of endoscopic procedure(s) have been discussed and reviewed. All questions answered. The patient agrees to proceed.    Past Medical History:  Diagnosis Date   Allergy    SEASONAL   Asthma    WITH ALLERGIES   DDD (degenerative disc disease), lumbosacral 11/08/2015   GERD (gastroesophageal reflux disease)    "SOMETIMES"   OSA on CPAP    Sleep apnea    NO CPAP,UPDATED 05/27/22    Past Surgical History:  Procedure Laterality Date   COLONOSCOPY     15 PLUS YEARS AGO IN [place],UPDATED 05/27/22   none     TUBES IN EARS     AS A CHILD    Prior to Admission medications   Medication Sig Start Date End Date Taking? Authorizing Provider  albuterol (VENTOLIN HFA) 108 (90 Base) MCG/ACT inhaler INHALE 2 PUFFS BY MOUTH EVERY 4 HOURS AS NEEDED FOR WHEEZING OR SHORTNESS OF BREATH OR COUGH OR URGENCY Patient not taking: Reported on 05/27/2022 01/23/21   [provider]  cetirizine (ZYRTEC) 10 MG tablet Take 10 mg by mouth daily.    [provider]  cyclobenzaprine (FLEXERIL) 10 MG tablet TAKE 1 TABLET BY MOUTH UP TO THREE TIMES DAILY AT BEDTIME AS NEEDED 02/04/21   [provider]  dicyclomine (BENTYL) 20 MG tablet Take 1 tablet (20 mg total) by mouth 3 (three) times daily as needed for spasms. Patient not taking: Reported on 05/27/2022 05/12/19   Quintella Reichert, MD  HYDROcodone-acetaminophen (NORCO/VICODIN) 5-325 MG tablet TK 1 T PO BID PRN P Patient not taking: Reported on 05/27/2022    [provider]  Multiple  Vitamins-Minerals (MULTIVITAMIN ADULT PO) Take by mouth.    [provider]  naproxen (EC NAPROSYN) 500 MG EC tablet Take 500 mg by mouth 2 (two) times daily. 10/09/21   [provider]  phentermine 15 MG capsule Take 15 mg by mouth daily.    [provider]    Current Outpatient Medications  Medication Sig Dispense Refill   albuterol (VENTOLIN HFA) 108 (90 Base) MCG/ACT inhaler INHALE 2 PUFFS BY MOUTH EVERY 4 HOURS AS NEEDED FOR WHEEZING OR SHORTNESS OF BREATH OR COUGH OR URGENCY (Patient not taking: Reported on 05/27/2022)     cetirizine (ZYRTEC) 10 MG tablet Take 10 mg by mouth daily.     cyclobenzaprine (FLEXERIL) 10 MG tablet TAKE 1 TABLET BY MOUTH UP TO THREE TIMES DAILY AT BEDTIME AS NEEDED     dicyclomine (BENTYL) 20 MG tablet Take 1 tablet (20 mg total) by mouth 3 (three) times daily as needed for spasms. (Patient not taking: Reported on 05/27/2022) 15 tablet 0   HYDROcodone-acetaminophen (NORCO/VICODIN) 5-325 MG tablet TK 1 T PO BID PRN P (Patient not taking: Reported on 05/27/2022)     Multiple Vitamins-Minerals (MULTIVITAMIN ADULT PO) Take by mouth.     naproxen (EC NAPROSYN) 500 MG EC tablet Take 500 mg by mouth 2 (two) times daily.     phentermine 15 MG capsule Take 15 mg by mouth daily.     Current Facility-Administered Medications  Medication Dose Route Frequency Provider Last Rate Last Admin   0.9 %  sodium chloride infusion  500 mL Intravenous Once Mauri Pole, MD        Allergies as of 06/19/2022   (No Known Allergies)    Family History  Problem Relation Age of Onset   Arthritis Mother    Gallbladder disease Mother    Healthy Father    Gallbladder disease Sister    Cancer Maternal Aunt    Heart disease Maternal Grandfather    Colon cancer Neg Hx    Stomach cancer Neg Hx    Rectal cancer Neg Hx    Esophageal cancer Neg Hx    Liver cancer Neg Hx    Colon polyps Neg Hx    Crohn's disease Neg Hx    Ulcerative colitis Neg Hx      Social History   Socioeconomic History   Marital status: Married    Spouse name: Not on file   Number of children: 0   Years of education: college   Highest education level: Not on file  Occupational History   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  Tobacco Use   Smoking status: Former    Packs/day: 1.00    Years: 3.00    Total pack years: 3.00    Types: Cigarettes    Quit date: 05/08/1996    Years since quitting: 26.1    Passive exposure: Past   Smokeless tobacco: Never  Vaping Use   Vaping Use: Never used  Substance and Sexual Activity   Alcohol use: Yes    Comment: OCCASSIONALY   Drug use: No   Sexual activity: Not on file  Other Topics Concern   Not on file  Social History Narrative   Drinks about 1/2 pot of coffee a day    Social Determinants of Health   Financial Resource Strain: Not on file  Food Insecurity: Not on file  Transportation Needs: Not on file  Physical Activity: Not on file  Stress: Not on file  Social Connections: Not on file  Intimate Partner Violence: Not on file    Review of Systems:  All other review of systems negative except as mentioned in the HPI.  Physical Exam: Vital signs in last 24 hours: Blood Pressure (Abnormal) 108/58   Pulse 84   Temperature 98.7 F (37.1 C)   Height '5\' 9"'$  (1.753 m)   Weight 284 lb (128.8 kg)   Oxygen Saturation 96%   Body Mass Index 41.94 kg/m  General:   Alert, NAD Lungs:  Clear .   Heart:  Regular rate and rhythm Abdomen:  Soft, nontender and nondistended. Neuro/Psych:  Alert and cooperative. Normal mood and affect. A and O x 3  Reviewed labs, radiology imaging, old records and pertinent past GI work up  Patient is appropriate for planned procedure(s) and anesthesia in an ambulatory setting   K. Denzil Magnuson , MD [phone]

## 2022-06-19 NOTE — Progress Notes (Signed)
Pt's states no medical or surgical changes since previsit or office visit. 

## 2022-06-19 NOTE — Op Note (Signed)
[facility] Patient Name: James Webster Procedure Date: 06/19/2022 10:20 AM MRN: [id number] Endoscopist: Mauri Pole , MD, [id number] Age: 46 Referring MD:  Date of Birth: 07/01/1976 Gender: Male Account #: [id number] Procedure:                Colonoscopy Indications:              Screening for colorectal malignant neoplasm Medicines:                Monitored Anesthesia Care Procedure:                Pre-Anesthesia Assessment:                           - Prior to the procedure, a History and Physical                            was performed, and patient medications and                            allergies were reviewed. The patient's tolerance of                            previous anesthesia was also reviewed. The risks                            and benefits of the procedure and the sedation                            options and risks were discussed with the patient.                            All questions were answered, and informed consent                            was obtained. Prior Anticoagulants: The patient has                            taken no anticoagulant or antiplatelet agents. ASA                            Grade Assessment: II - A patient with mild systemic                            disease. After reviewing the risks and benefits,                            the patient was deemed in satisfactory condition to                            undergo the procedure.                           After obtaining informed consent, the colonoscope  was passed under direct vision. Throughout the                            procedure, the patient's blood pressure, pulse, and                            oxygen saturations were monitored continuously. The                            PCF-HQ190L Colonoscope was introduced through the                            anus and advanced to the the cecum, identified by                             appendiceal orifice and ileocecal valve. The                            colonoscopy was performed without difficulty. The                            patient tolerated the procedure well. Scope In: 10:58:35 AM Scope Out: 11:12:16 AM Scope Withdrawal Time: 0 hours 9 minutes 47 seconds  Total Procedure Duration: 0 hours 13 minutes 41 seconds  Findings:                 The perianal and digital rectal examinations were                            normal.                           Three sessile polyps were found in the rectum. The                            polyps were 4 to 6 mm in size. These polyps were                            removed with a cold snare. Resection and retrieval                            were complete.                           Non-bleeding internal hemorrhoids were found during                            retroflexion. The hemorrhoids were medium-sized. Complications:            No immediate complications. Estimated Blood Loss:     Estimated blood loss was minimal. Impression:               - Three 4 to 6 mm polyps in the rectum, removed  with a cold snare. Resected and retrieved.                           - Non-bleeding internal hemorrhoids. Recommendation:           - Patient has a contact number available for                            emergencies. The signs and symptoms of potential                            delayed complications were discussed with the                            patient. Return to normal activities tomorrow.                            Written discharge instructions were provided to the                            patient.                           - Resume previous diet.                           - Continue present medications.                           - Await pathology results.                           - Repeat colonoscopy in 5-10 years for surveillance                            based on pathology results. Mauri Pole, MD 06/19/2022 11:27:47 AM This report has been signed electronically.

## 2022-06-20 ENCOUNTER — Telehealth: Payer: Self-pay | Admitting: *Deleted

## 2022-06-20 NOTE — Telephone Encounter (Signed)
  Follow up Call-     06/19/2022    9:51 AM  Call back number  Post procedure Call Back phone  # [phone]  Permission to leave phone message Yes   [facility]

## 2022-06-23 ENCOUNTER — Ambulatory Visit (INDEPENDENT_AMBULATORY_CARE_PROVIDER_SITE_OTHER): Payer: BC Managed Care – PPO

## 2022-06-23 ENCOUNTER — Ambulatory Visit: Payer: BC Managed Care – PPO | Admitting: Podiatry

## 2022-06-23 DIAGNOSIS — M779 Enthesopathy, unspecified: Secondary | ICD-10-CM

## 2022-06-23 DIAGNOSIS — M79642 Pain in left hand: Secondary | ICD-10-CM | POA: Diagnosis not present

## 2022-06-23 DIAGNOSIS — M722 Plantar fascial fibromatosis: Secondary | ICD-10-CM

## 2022-06-23 DIAGNOSIS — M79641 Pain in right hand: Secondary | ICD-10-CM | POA: Diagnosis not present

## 2022-06-23 MED ORDER — TRIAMCINOLONE ACETONIDE 10 MG/ML IJ SUSP
20.0000 mg | Freq: Once | INTRAMUSCULAR | Status: AC
  Administered 2022-06-23: 20 mg

## 2022-06-23 MED ORDER — DICLOFENAC SODIUM 75 MG PO TBEC: 75.0000 mg | DELAYED_RELEASE_TABLET | Freq: Two times a day (BID) | ORAL | 2 refills | Status: DC

## 2022-06-23 NOTE — Progress Notes (Signed)
Subjective:   Patient ID: James Webster, male   DOB: 46 y.o.   MRN: [id number]   HPI Patient presents with intense discomfort in the plantar heel region both feet and also started to get pain in the dorsal aspect of the right foot and into the arch area bilateral.  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  Patient is moderately obese does not smoke likes to be active   Review of Systems  All other systems reviewed and are negative.       Objective:  Physical Exam Vitals and nursing note reviewed.  Constitutional:      Appearance: He is well-developed.  Pulmonary:     Effort: Pulmonary effort is normal.  Musculoskeletal:        General: Normal range of motion.  Skin:    General: Skin is warm.  Neurological:     Mental Status: He is alert.     Neurovascular status intact muscle strength adequate range of motion adequate exquisite discomfort noted medial fascial band bilateral with inflammation fluid around the tendon at its insertion calcaneus bilateral.  Moderate depression of the arch noted good digital perfusion well-oriented x 3 mild discomfort dorsal medial aspect right foot and into the arch region bilateral     Assessment:  Acute plantar fasciitis bilateral probable compensatory tendinitis right over left     Plan:  H&P reviewed condition discussed at great length.  At this point I went ahead I did sterile prep I injected the fascia bilateral 3 mg Kenalog 5 mg Xylocaine at insertion applied fascial brace bilateral lift of the arch discussed orthotics and we will review him in the next several weeks and decide on this and placed on diclofenac 75 mg twice daily.  Reappoint to recheck 3 weeks or earlier if needed  X-rays indicate small spur formation no indication stress fracture arthritis associated with condition

## 2022-06-23 NOTE — Patient Instructions (Signed)

## 2022-07-03 ENCOUNTER — Encounter: Payer: Self-pay | Admitting: Gastroenterology

## 2022-07-06 DIAGNOSIS — H6693 Otitis media, unspecified, bilateral: Secondary | ICD-10-CM | POA: Diagnosis not present

## 2022-07-06 DIAGNOSIS — J0100 Acute maxillary sinusitis, unspecified: Secondary | ICD-10-CM | POA: Diagnosis not present

## 2022-07-06 DIAGNOSIS — B9689 Other specified bacterial agents as the cause of diseases classified elsewhere: Secondary | ICD-10-CM | POA: Diagnosis not present

## 2022-07-14 ENCOUNTER — Encounter: Payer: Self-pay | Admitting: Podiatry

## 2022-07-14 ENCOUNTER — Ambulatory Visit: Payer: BC Managed Care – PPO | Admitting: Podiatry

## 2022-07-14 DIAGNOSIS — M722 Plantar fascial fibromatosis: Secondary | ICD-10-CM

## 2022-07-14 MED ORDER — TRIAMCINOLONE ACETONIDE 10 MG/ML IJ SUSP
10.0000 mg | Freq: Once | INTRAMUSCULAR | Status: AC
  Administered 2022-07-14: 10 mg

## 2022-07-14 NOTE — Progress Notes (Signed)
Subjective:   Patient ID: James Webster, male   DOB: 47 y.o.   MRN: [id number]   HPI Patient presents stating that he is quite a bit better still having pain in his heel the left now worse than the right and still getting some pain in his right forefoot.  Patient was not able to take the oral medicine due to a sinus infection show did not have the effect from that   ROS      Objective:  Physical Exam  Neurovascular status intact muscle strength found to be adequate patient's plantar heel are improved the right seems more improved than the left and then there is moderate discomfort along the course of the extensor hallucis longus tendon.  Patient's left heel still was quite sore when pressed and patient is trying to be more active and work on weight control     Assessment:  Plantar fasciitis still present left over right with inflammation along with tendinitis right     Plan:  H&P discussed long-term orthotics we will get a hold off on this and I want the patient to utilize oral anti-inflammatories consisting of the diclofenac that I had written for him.  I did go ahead I did sterile prep I reinjected the plantar fascia left 3 mg Kenalog 5 mg Xylocaine and I went ahead and I dispensed a night splint with instructions on ice therapy.  Reappoint to recheck

## 2022-07-16 DIAGNOSIS — G5603 Carpal tunnel syndrome, bilateral upper limbs: Secondary | ICD-10-CM | POA: Diagnosis not present

## 2022-07-22 DIAGNOSIS — M79641 Pain in right hand: Secondary | ICD-10-CM | POA: Diagnosis not present

## 2022-07-22 DIAGNOSIS — G5603 Carpal tunnel syndrome, bilateral upper limbs: Secondary | ICD-10-CM | POA: Diagnosis not present

## 2022-07-22 DIAGNOSIS — M79642 Pain in left hand: Secondary | ICD-10-CM | POA: Diagnosis not present

## 2022-07-23 ENCOUNTER — Encounter: Payer: Self-pay | Admitting: Podiatry

## 2022-07-25 ENCOUNTER — Encounter: Payer: Self-pay | Admitting: Podiatry

## 2022-08-04 ENCOUNTER — Ambulatory Visit: Payer: BC Managed Care – PPO | Admitting: Podiatry

## 2022-08-04 ENCOUNTER — Encounter: Payer: Self-pay | Admitting: Podiatry

## 2022-08-04 DIAGNOSIS — M722 Plantar fascial fibromatosis: Secondary | ICD-10-CM | POA: Diagnosis not present

## 2022-08-04 NOTE — Progress Notes (Signed)
Subjective:   Patient ID: James Webster, male   DOB: 47 y.o.   MRN: [id number]   HPI Patient presents stating he is still having some problems with his feet and does have some improvement but still having pain.  States the splint has been helpful but he needs a second once we can do both feet at the same time   ROS      Objective:  Physical Exam  Neurovascular status intact inflammation pain of the mid arch plantar fascia bilateral improved but still present     Assessment:  Continue of inflammatory fasciitis especially with activities and patient does walk at least 10,000 feet a day with work     Plan:  Reviewed condition recommended the continuation of conservative care and dispensed night splint so we can do both stretches at the same time which has been beneficial.  Casted for functional orthotics due to the longstanding nature of problems educating him on them and patient will be seen back when ready and encouraged to call me with questions concerns which may arise

## 2022-08-08 DIAGNOSIS — R052 Subacute cough: Secondary | ICD-10-CM | POA: Diagnosis not present

## 2022-08-08 DIAGNOSIS — J40 Bronchitis, not specified as acute or chronic: Secondary | ICD-10-CM | POA: Diagnosis not present

## 2022-08-12 ENCOUNTER — Ambulatory Visit: Payer: BC Managed Care – PPO | Admitting: Adult Health

## 2022-09-02 DIAGNOSIS — M79642 Pain in left hand: Secondary | ICD-10-CM | POA: Diagnosis not present

## 2022-09-02 DIAGNOSIS — M79641 Pain in right hand: Secondary | ICD-10-CM | POA: Diagnosis not present

## 2022-09-02 DIAGNOSIS — G5603 Carpal tunnel syndrome, bilateral upper limbs: Secondary | ICD-10-CM | POA: Diagnosis not present

## 2022-09-08 ENCOUNTER — Telehealth: Payer: Self-pay | Admitting: Podiatry

## 2022-09-08 NOTE — Telephone Encounter (Signed)
Lmom to call back to schedule picking up orthotics     Balance is 91.50

## 2022-09-16 ENCOUNTER — Ambulatory Visit: Payer: BC Managed Care – PPO | Admitting: Adult Health

## 2022-09-19 ENCOUNTER — Other Ambulatory Visit: Payer: BC Managed Care – PPO

## 2022-09-23 ENCOUNTER — Ambulatory Visit (INDEPENDENT_AMBULATORY_CARE_PROVIDER_SITE_OTHER): Payer: BC Managed Care – PPO

## 2022-09-23 DIAGNOSIS — M722 Plantar fascial fibromatosis: Secondary | ICD-10-CM

## 2022-09-23 NOTE — Progress Notes (Signed)
Patient presents today to pick up custom molded foot orthotics recommended by Dr. Paulla Dolly.   Orthotics were dispensed and fit was satisfactory. Reviewed instructions for break-in and wear. Written instructions given to patient.  Patient will follow up as needed.   [facility] - order # [id number]

## 2022-10-20 ENCOUNTER — Ambulatory Visit: Payer: BC Managed Care – PPO | Admitting: Adult Health

## 2022-10-20 ENCOUNTER — Encounter: Payer: Self-pay | Admitting: Adult Health

## 2022-10-20 VITALS — BP 117/74 | HR 104 | Ht 69.0 in | Wt 295.0 lb

## 2022-10-20 DIAGNOSIS — G4733 Obstructive sleep apnea (adult) (pediatric): Secondary | ICD-10-CM

## 2022-10-20 NOTE — Progress Notes (Signed)
PATIENT: James Webster DOB: [birth year]  REASON FOR VISIT: follow up HISTORY FROM: patient  Chief Complaint  Patient presents with   Follow-up    Rm 19, here alone for CPAP follow up. Patient states he has not been using CPAP for some time. He states that the mask is more aggravating and makes it hard for him to sleep. He states that his wife is scared at night and interested in alternative treatments due to inability to tolerate masks and straps. States he has an appointment tomorrow with medical weight management.     HISTORY OF PRESENT ILLNESS: Today 10/20/22:  James Webster is a 47 y.o. male with a history of obstructive sleep apnea. Returns today for follow-up.  He reports he has not been using the CPAP machine in quite some time.  States that he was unable to adjust to the mask.  He states that he is extremely sleepy and he stops breathing at night which scared his wife.  He is questioning alternative treatments.  He also reports that he has been and insomniac his entire life.  He is currently working 3 jobs.  Typically does not go to bed till after midnight and will wake up around 4 AM.  He states that once he wakes up he is unable to go back to sleep.  His daytime job is 8-5.  He does drink caffeine-usually a half a pot of coffee in the morning and then 1 cup of coffee around 4 PM.     05/10/20: James Webster is a 47 year old male with a history of obstructive sleep apnea on CPAP.  His download indicates that he uses machine 11 out of 30 days for compliance of 37%.  He uses machine greater than 4 hours each night.  On average he uses it 5 hours and 46 minutes.  His residual AHI is 3.3 on 7 to 11 cm of water with EPR 3.  Leak in the 95th percentile is 37.1 L/min.  He reports that he is unable to use the CPAP.  Reports that he has tried it for 3 years and cannot tolerate it.  He reports that has not improved his symptoms at all.  He tried 3 different mask with no benefit.  He  does note that he has had weight gain since the last visit.  He returns today for an evaluation.  HISTORY James Webster is a 47 year old male with a history of obstructive sleep apnea on CPAP.  His download indicates that he uses machine 23 out of 30 days for compliance of 77%.  He uses machine greater than 4 hours 21 days for compliance of 70%.  On average he uses his machine 5 hours and 47 minutes.  Residual AHI is 1.7 on 7 to 11 cm of water with EPR of 3.  Leak in the 95th percentile is 10.5.  He reports that the mask was made helpful  REVIEW OF SYSTEMS: Out of a complete 14 system review of symptoms, the patient complains only of the following symptoms, and all other reviewed systems are negative.  FSS 44 ESS 12  ALLERGIES: No Known Allergies  HOME MEDICATIONS: Outpatient Medications Prior to Visit  Medication Sig Dispense Refill   albuterol (VENTOLIN HFA) 108 (90 Base) MCG/ACT inhaler      cetirizine (ZYRTEC) 10 MG tablet Take 10 mg by mouth daily.     cyclobenzaprine (FLEXERIL) 10 MG tablet TAKE 1 TABLET BY MOUTH UP TO THREE TIMES DAILY AT BEDTIME AS NEEDED  diclofenac (VOLTAREN) 75 MG EC tablet Take 1 tablet (75 mg total) by mouth 2 (two) times daily. 50 tablet 2   HYDROcodone-acetaminophen (NORCO/VICODIN) 5-325 MG tablet      Multiple Vitamins-Minerals (MULTIVITAMIN ADULT PO) Take by mouth.     phentermine 15 MG capsule Take 15 mg by mouth daily.     dicyclomine (BENTYL) 20 MG tablet Take 1 tablet (20 mg total) by mouth 3 (three) times daily as needed for spasms. 15 tablet 0   naproxen (EC NAPROSYN) 500 MG EC tablet Take 500 mg by mouth 2 (two) times daily.     Facility-Administered Medications Prior to Visit  Medication Dose Route Frequency Provider Last Rate Last Admin   0.9 %  sodium chloride infusion  500 mL Intravenous Once Nandigam, Eleonore Chiquito, MD        PAST MEDICAL HISTORY: Past Medical History:  Diagnosis Date   Allergy    SEASONAL   Asthma    WITH ALLERGIES    DDD (degenerative disc disease), lumbosacral 11/08/2015   GERD (gastroesophageal reflux disease)    "SOMETIMES"   OSA on CPAP    Sleep apnea    NO CPAP,UPDATED 05/27/22    PAST SURGICAL HISTORY: Past Surgical History:  Procedure Laterality Date   COLONOSCOPY     15 PLUS YEARS AGO IN [place],UPDATED 05/27/22   none     TUBES IN EARS     AS A CHILD    FAMILY HISTORY: Family History  Problem Relation Age of Onset   Arthritis Mother    Gallbladder disease Mother    Healthy Father    Gallbladder disease Sister    Cancer Maternal Aunt    Heart disease Maternal Grandfather    Colon cancer Neg Hx    Stomach cancer Neg Hx    Rectal cancer Neg Hx    Esophageal cancer Neg Hx    Liver cancer Neg Hx    Colon polyps Neg Hx    Crohn's disease Neg Hx    Ulcerative colitis Neg Hx     SOCIAL HISTORY: Social History   Socioeconomic History   Marital status: Married    Spouse name: Not on file   Number of children: 0   Years of education: college   Highest education level: Not on file  Occupational History   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[place]  Tobacco Use   Smoking status: Former    Packs/day: 1.00    Years: 3.00    Additional pack years: 0.00    Total pack years: 3.00    Types: Cigarettes    Quit date: 05/08/1996    Years since quitting: 26.4    Passive exposure: Past   Smokeless tobacco: Never  Vaping Use   Vaping Use: Never used  Substance and Sexual Activity   Alcohol use: Yes    Comment: OCCASSIONALY   Drug use: No   Sexual activity: Not on file  Other Topics Concern   Not on file  Social History Narrative   Drinks about 1/2 pot of coffee a day    Social Determinants of Health   Financial Resource Strain: Not on file  Food Insecurity: Not on file  Transportation Needs: Not on file  Physical Activity: Not on file  Stress: Not on file  Social Connections: Not on file  Intimate Partner Violence: Not on file      PHYSICAL EXAM  Vitals:   10/20/22 1438  BP:  117/74  Pulse: (!) 104  Weight: 295 lb (  133.8 kg)  Height:  (1.753 m)    Body mass index is 43.56 kg/m.  Generalized: Well developed, in no acute distress    Neurological examination  Mentation: Alert oriented to time, place, history taking. Follows all commands speech and language fluent     DIAGNOSTIC DATA (LABS, IMAGING, TESTING) - I reviewed patient records, labs, notes, testing and imaging myself where available.  Lab Results  Component Value Date   WBC 11.3 (H) 05/12/2019   HGB 16.9 05/12/2019   HCT 49.9 05/12/2019   MCV 86.5 05/12/2019   PLT 273 05/12/2019      Component Value Date/Time   NA 136 05/12/2019 1528   K 3.6 05/12/2019 1528   CL 103 05/12/2019 1528   CO2 20 (L) 05/12/2019 1528   GLUCOSE 95 05/12/2019 1528   BUN 14 05/12/2019 1528   CREATININE 1.23 05/12/2019 1528   CALCIUM 9.2 05/12/2019 1528   PROT 7.4 05/12/2019 2100   ALBUMIN 4.2 05/12/2019 2100   AST 21 05/12/2019 2100   ALT 28 05/12/2019 2100   ALKPHOS 76 05/12/2019 2100   BILITOT 1.0 05/12/2019 2100   GFRNONAA >60 05/12/2019 1528   GFRAA >60 05/12/2019 1528     ASSESSMENT AND PLAN 47 y.o. year old male  has a past medical history of Allergy, Asthma, DDD (degenerative disc disease), lumbosacral (11/08/2015), GERD (gastroesophageal reflux disease), OSA on CPAP, and Sleep apnea. here with:  OSA on CPAP   I reviewed different treatment options in detail.  We reviewed the inspire device which the patient is not interested in at this time.  We also reviewed dental device however the patient does not think he would be able to tolerate this andhe is also concerned about out-of-pocket cost.  He is willing to retry CPAP with a different DME company.  We will try [facility].  First repeat a home sleep test and pending results we will send a new order to [facility].  Also advised that if he is compliant with CPAP therapy but is still struggling with insomnia we can consider adding on a sleep aid in  the future.    Butch Penny, MSN, NP-C 10/20/2022, 2:32 PM [facility] [address] [place], [place] [address] [phone]

## 2022-10-21 ENCOUNTER — Other Ambulatory Visit (HOSPITAL_BASED_OUTPATIENT_CLINIC_OR_DEPARTMENT_OTHER): Payer: Self-pay

## 2022-10-21 DIAGNOSIS — R5383 Other fatigue: Secondary | ICD-10-CM | POA: Diagnosis not present

## 2022-10-21 DIAGNOSIS — E7800 Pure hypercholesterolemia, unspecified: Secondary | ICD-10-CM | POA: Diagnosis not present

## 2022-10-21 DIAGNOSIS — R300 Dysuria: Secondary | ICD-10-CM | POA: Diagnosis not present

## 2022-10-21 DIAGNOSIS — R0602 Shortness of breath: Secondary | ICD-10-CM | POA: Diagnosis not present

## 2022-10-21 DIAGNOSIS — E291 Testicular hypofunction: Secondary | ICD-10-CM | POA: Diagnosis not present

## 2022-10-21 DIAGNOSIS — Z125 Encounter for screening for malignant neoplasm of prostate: Secondary | ICD-10-CM | POA: Diagnosis not present

## 2022-10-21 DIAGNOSIS — E559 Vitamin D deficiency, unspecified: Secondary | ICD-10-CM | POA: Diagnosis not present

## 2022-10-21 DIAGNOSIS — Z131 Encounter for screening for diabetes mellitus: Secondary | ICD-10-CM | POA: Diagnosis not present

## 2022-10-21 DIAGNOSIS — Z79899 Other long term (current) drug therapy: Secondary | ICD-10-CM | POA: Diagnosis not present

## 2022-10-21 DIAGNOSIS — Z6841 Body Mass Index (BMI) 40.0 and over, adult: Secondary | ICD-10-CM | POA: Diagnosis not present

## 2022-10-21 DIAGNOSIS — E8881 Metabolic syndrome: Secondary | ICD-10-CM | POA: Diagnosis not present

## 2022-10-21 DIAGNOSIS — R635 Abnormal weight gain: Secondary | ICD-10-CM | POA: Diagnosis not present

## 2022-10-21 DIAGNOSIS — D539 Nutritional anemia, unspecified: Secondary | ICD-10-CM | POA: Diagnosis not present

## 2022-10-21 DIAGNOSIS — E88819 Insulin resistance, unspecified: Secondary | ICD-10-CM | POA: Diagnosis not present

## 2022-10-21 MED ORDER — ZEPBOUND 2.5 MG/0.5ML ~~LOC~~ SOAJ
2.5000 mg | SUBCUTANEOUS | 0 refills | Status: DC
  Filled 2022-10-21 – 2022-11-06 (×5): qty 2, 28d supply, fill #0

## 2022-10-22 ENCOUNTER — Other Ambulatory Visit (HOSPITAL_BASED_OUTPATIENT_CLINIC_OR_DEPARTMENT_OTHER): Payer: Self-pay

## 2022-10-23 ENCOUNTER — Other Ambulatory Visit (HOSPITAL_BASED_OUTPATIENT_CLINIC_OR_DEPARTMENT_OTHER): Payer: Self-pay

## 2022-10-23 DIAGNOSIS — R7401 Elevation of levels of liver transaminase levels: Secondary | ICD-10-CM | POA: Diagnosis not present

## 2022-10-23 DIAGNOSIS — Z6841 Body Mass Index (BMI) 40.0 and over, adult: Secondary | ICD-10-CM | POA: Diagnosis not present

## 2022-10-23 DIAGNOSIS — D72828 Other elevated white blood cell count: Secondary | ICD-10-CM | POA: Diagnosis not present

## 2022-10-24 ENCOUNTER — Other Ambulatory Visit (HOSPITAL_BASED_OUTPATIENT_CLINIC_OR_DEPARTMENT_OTHER): Payer: Self-pay

## 2022-10-27 ENCOUNTER — Other Ambulatory Visit (HOSPITAL_BASED_OUTPATIENT_CLINIC_OR_DEPARTMENT_OTHER): Payer: Self-pay

## 2022-10-28 ENCOUNTER — Other Ambulatory Visit (HOSPITAL_BASED_OUTPATIENT_CLINIC_OR_DEPARTMENT_OTHER): Payer: Self-pay

## 2022-10-30 ENCOUNTER — Other Ambulatory Visit: Payer: Self-pay

## 2022-10-30 ENCOUNTER — Other Ambulatory Visit (HOSPITAL_BASED_OUTPATIENT_CLINIC_OR_DEPARTMENT_OTHER): Payer: Self-pay

## 2022-11-01 ENCOUNTER — Encounter: Payer: Self-pay | Admitting: Adult Health

## 2022-11-05 NOTE — Telephone Encounter (Signed)
MAIL OUT HST- [employer] no auth req spoke to Franklin A ref # [id number]   On the schedule for 11/12/22.

## 2022-11-06 ENCOUNTER — Other Ambulatory Visit (HOSPITAL_BASED_OUTPATIENT_CLINIC_OR_DEPARTMENT_OTHER): Payer: Self-pay

## 2022-11-10 ENCOUNTER — Other Ambulatory Visit (HOSPITAL_BASED_OUTPATIENT_CLINIC_OR_DEPARTMENT_OTHER): Payer: Self-pay

## 2022-11-11 ENCOUNTER — Other Ambulatory Visit (HOSPITAL_BASED_OUTPATIENT_CLINIC_OR_DEPARTMENT_OTHER): Payer: Self-pay

## 2022-11-12 ENCOUNTER — Ambulatory Visit: Payer: BC Managed Care – PPO | Admitting: Neurology

## 2022-11-12 DIAGNOSIS — G4733 Obstructive sleep apnea (adult) (pediatric): Secondary | ICD-10-CM | POA: Diagnosis not present

## 2022-11-13 ENCOUNTER — Other Ambulatory Visit (HOSPITAL_BASED_OUTPATIENT_CLINIC_OR_DEPARTMENT_OTHER): Payer: Self-pay

## 2022-11-17 ENCOUNTER — Other Ambulatory Visit (HOSPITAL_BASED_OUTPATIENT_CLINIC_OR_DEPARTMENT_OTHER): Payer: Self-pay

## 2022-11-18 ENCOUNTER — Other Ambulatory Visit (HOSPITAL_BASED_OUTPATIENT_CLINIC_OR_DEPARTMENT_OTHER): Payer: Self-pay

## 2022-11-18 DIAGNOSIS — K760 Fatty (change of) liver, not elsewhere classified: Secondary | ICD-10-CM | POA: Diagnosis not present

## 2022-11-18 DIAGNOSIS — M79641 Pain in right hand: Secondary | ICD-10-CM | POA: Diagnosis not present

## 2022-11-18 DIAGNOSIS — M79642 Pain in left hand: Secondary | ICD-10-CM | POA: Diagnosis not present

## 2022-11-18 DIAGNOSIS — R7401 Elevation of levels of liver transaminase levels: Secondary | ICD-10-CM | POA: Diagnosis not present

## 2022-11-18 DIAGNOSIS — G5603 Carpal tunnel syndrome, bilateral upper limbs: Secondary | ICD-10-CM | POA: Diagnosis not present

## 2022-11-19 DIAGNOSIS — Z6841 Body Mass Index (BMI) 40.0 and over, adult: Secondary | ICD-10-CM | POA: Diagnosis not present

## 2022-11-19 DIAGNOSIS — E559 Vitamin D deficiency, unspecified: Secondary | ICD-10-CM | POA: Diagnosis not present

## 2022-11-19 DIAGNOSIS — R635 Abnormal weight gain: Secondary | ICD-10-CM | POA: Diagnosis not present

## 2022-11-19 DIAGNOSIS — N182 Chronic kidney disease, stage 2 (mild): Secondary | ICD-10-CM | POA: Diagnosis not present

## 2022-11-19 DIAGNOSIS — E7800 Pure hypercholesterolemia, unspecified: Secondary | ICD-10-CM | POA: Diagnosis not present

## 2022-11-20 ENCOUNTER — Other Ambulatory Visit (HOSPITAL_BASED_OUTPATIENT_CLINIC_OR_DEPARTMENT_OTHER): Payer: Self-pay

## 2022-11-20 MED ORDER — ZEPBOUND 2.5 MG/0.5ML ~~LOC~~ SOAJ
2.5000 mg | SUBCUTANEOUS | 0 refills | Status: DC
  Filled 2022-11-20 – 2022-12-15 (×2): qty 2, 28d supply, fill #0

## 2022-11-21 ENCOUNTER — Other Ambulatory Visit (HOSPITAL_BASED_OUTPATIENT_CLINIC_OR_DEPARTMENT_OTHER): Payer: Self-pay

## 2022-11-24 DIAGNOSIS — R7401 Elevation of levels of liver transaminase levels: Secondary | ICD-10-CM | POA: Diagnosis not present

## 2022-11-25 NOTE — Progress Notes (Signed)
See procedure note.

## 2022-11-25 NOTE — Procedures (Signed)
[facility]  HOME SLEEP TEST (Watch PAT) REPORT - Mail-out Device  STUDY DATE: 11/21/2022  DOB: 1975/12/05  MRN: [id number]  ORDERING CLINICIAN: Huston Foley, MD, PhD   REFERRING CLINICIAN: Butch Penny, NP  CLINICAL INFORMATION/HISTORY: 47 year old male with an underlying medical history of allergies, asthma, degenerative lumbosacral disc disease, reflux disease, and severe obesity with a BMI of over 40, who presents for reevaluation of his obstructive sleep apnea.  He has not used a PAP machine in the recent past.  Epworth sleepiness score: 12/24.  BMI: 43.8 kg/m  FINDINGS:   Sleep Summary:   Total Recording Time (hours, min): 7 hours, 59 min  Total Sleep Time (hours, min):  7 hours, 17 min  Percent REM (%):    39.7%   Respiratory Indices:   Calculated pAHI (per hour):  19.3/hour         REM pAHI:    28.4/hour       NREM pAHI: 13.3/hour  Central pAHI: 0.7/hour  Oxygen Saturation Statistics:    Oxygen Saturation (%) Mean: 95%   Minimum oxygen saturation (%):                 82%   O2 Saturation Range (%): 82 - 98%    O2 Saturation (minutes) <=88%: 1.4 min  Pulse Rate Statistics:   Pulse Mean (bpm):    73/min    Pulse Range (55 - 105/min)   IMPRESSION: OSA (obstructive sleep apnea), moderate  RECOMMENDATION:  This home sleep test demonstrates moderate obstructive sleep apnea with a total AHI of 19.3/hour and O2 nadir of 82%.  Fairly consistent snoring was noted throughout the study, mostly in the moderate range, at times louder, at times mild.  Ongoing treatment with a positive airway pressure device (PAP) device is recommended.  If the patient is eligible for new equipment, a new AutoPap machine can be prescribed.  If the patient has ongoing difficulty with acclimation or tolerance with PAP therapy, A full night titration study may be considered to optimize treatment settings, monitor proper oxygen saturations and aid with improvement  of tolerance and adherence. Alternative treatment options may include a dental device through dentistry or orthodontics in selected patients or Inspire (hypoglossal nerve stimulator) in carefully selected patients (meeting inclusion criteria).  Concomitant weight loss is recommended (where clinically appropriate).  For sleep onset difficulties, melatonin before bedtime should be considered. Please note that untreated obstructive sleep apnea may carry additional perioperative morbidity. Patients with significant obstructive sleep apnea should receive perioperative PAP therapy and the surgeons and particularly the anesthesiologist should be informed of the diagnosis and the severity of the sleep disordered breathing. The patient should be cautioned not to drive, work at heights, or operate dangerous or heavy equipment when tired or sleepy. Review and reiteration of good sleep hygiene measures should be pursued with any patient. Other causes of the patient's symptoms, including circadian rhythm disturbances, an underlying mood disorder, medication effect and/or an underlying medical problem cannot be ruled out based on this test. Clinical correlation is recommended.  The patient and his referring provider will be notified of the test results. The patient will be seen in follow up in sleep clinic at [facility].  I certify that I have reviewed the raw data recording prior to the issuance of this report in accordance with the standards of the American Academy of Sleep Medicine (AASM).  INTERPRETING PHYSICIAN:   Huston Foley, MD, PhD Medical Director, [facility]) Diplomat,  ABPN (Neurology and Sleep)   [facility] [address] [place], [place] [address] [phone]

## 2022-12-02 ENCOUNTER — Encounter: Payer: Self-pay | Admitting: Adult Health

## 2022-12-02 DIAGNOSIS — G4733 Obstructive sleep apnea (adult) (pediatric): Secondary | ICD-10-CM

## 2022-12-02 NOTE — Addendum Note (Signed)
Addended by: Enedina Finner on: 12/02/2022 01:40 PM   Modules accepted: Orders

## 2022-12-02 NOTE — Telephone Encounter (Signed)
Retracted order from [facility]. Order sent to [facility] and pt's profile has been updated to reflect new DME company.

## 2022-12-02 NOTE — Telephone Encounter (Signed)
Order sent to [facility] today for processing.

## 2022-12-15 ENCOUNTER — Other Ambulatory Visit (HOSPITAL_BASED_OUTPATIENT_CLINIC_OR_DEPARTMENT_OTHER): Payer: Self-pay

## 2022-12-17 DIAGNOSIS — G4733 Obstructive sleep apnea (adult) (pediatric): Secondary | ICD-10-CM | POA: Diagnosis not present

## 2022-12-20 DIAGNOSIS — E7800 Pure hypercholesterolemia, unspecified: Secondary | ICD-10-CM | POA: Diagnosis not present

## 2022-12-20 DIAGNOSIS — Z6841 Body Mass Index (BMI) 40.0 and over, adult: Secondary | ICD-10-CM | POA: Diagnosis not present

## 2022-12-20 DIAGNOSIS — N182 Chronic kidney disease, stage 2 (mild): Secondary | ICD-10-CM | POA: Diagnosis not present

## 2022-12-20 DIAGNOSIS — E559 Vitamin D deficiency, unspecified: Secondary | ICD-10-CM | POA: Diagnosis not present

## 2022-12-23 ENCOUNTER — Ambulatory Visit: Payer: BC Managed Care – PPO | Admitting: Nurse Practitioner

## 2023-01-20 DIAGNOSIS — G5603 Carpal tunnel syndrome, bilateral upper limbs: Secondary | ICD-10-CM | POA: Diagnosis not present

## 2023-01-21 DIAGNOSIS — G4733 Obstructive sleep apnea (adult) (pediatric): Secondary | ICD-10-CM | POA: Diagnosis not present

## 2023-01-22 ENCOUNTER — Other Ambulatory Visit (HOSPITAL_BASED_OUTPATIENT_CLINIC_OR_DEPARTMENT_OTHER): Payer: Self-pay

## 2023-01-22 DIAGNOSIS — Z6841 Body Mass Index (BMI) 40.0 and over, adult: Secondary | ICD-10-CM | POA: Diagnosis not present

## 2023-01-22 DIAGNOSIS — E559 Vitamin D deficiency, unspecified: Secondary | ICD-10-CM | POA: Diagnosis not present

## 2023-01-22 DIAGNOSIS — E7800 Pure hypercholesterolemia, unspecified: Secondary | ICD-10-CM | POA: Diagnosis not present

## 2023-01-22 DIAGNOSIS — N182 Chronic kidney disease, stage 2 (mild): Secondary | ICD-10-CM | POA: Diagnosis not present

## 2023-01-22 MED ORDER — ZEPBOUND 5 MG/0.5ML ~~LOC~~ SOAJ
5.0000 mg | SUBCUTANEOUS | 0 refills | Status: DC
  Filled 2023-01-22: qty 2, 28d supply, fill #0

## 2023-01-22 MED ORDER — VITAMIN D (ERGOCALCIFEROL) 1.25 MG (50000 UNIT) PO CAPS
50000.0000 [IU] | ORAL_CAPSULE | ORAL | 0 refills | Status: DC
Start: 2023-01-22 — End: 2023-09-24
  Filled 2023-01-22: qty 4, 28d supply, fill #0
  Filled 2023-02-16: qty 4, 28d supply, fill #1
  Filled 2023-03-12 – 2023-03-16 (×2): qty 4, 28d supply, fill #2

## 2023-02-18 ENCOUNTER — Other Ambulatory Visit (HOSPITAL_BASED_OUTPATIENT_CLINIC_OR_DEPARTMENT_OTHER): Payer: Self-pay

## 2023-02-18 MED ORDER — ZEPBOUND 5 MG/0.5ML ~~LOC~~ SOAJ
5.0000 mg | SUBCUTANEOUS | 0 refills | Status: DC
  Filled 2023-02-18: qty 2, 28d supply, fill #0

## 2023-02-20 DIAGNOSIS — N182 Chronic kidney disease, stage 2 (mild): Secondary | ICD-10-CM | POA: Diagnosis not present

## 2023-02-20 DIAGNOSIS — Z6839 Body mass index (BMI) 39.0-39.9, adult: Secondary | ICD-10-CM | POA: Diagnosis not present

## 2023-02-20 DIAGNOSIS — E7800 Pure hypercholesterolemia, unspecified: Secondary | ICD-10-CM | POA: Diagnosis not present

## 2023-02-20 DIAGNOSIS — E559 Vitamin D deficiency, unspecified: Secondary | ICD-10-CM | POA: Diagnosis not present

## 2023-02-20 DIAGNOSIS — E6609 Other obesity due to excess calories: Secondary | ICD-10-CM | POA: Diagnosis not present

## 2023-03-12 ENCOUNTER — Other Ambulatory Visit: Payer: Self-pay

## 2023-03-12 ENCOUNTER — Other Ambulatory Visit (HOSPITAL_COMMUNITY): Payer: Self-pay

## 2023-03-14 DIAGNOSIS — Z6839 Body mass index (BMI) 39.0-39.9, adult: Secondary | ICD-10-CM | POA: Diagnosis not present

## 2023-03-14 DIAGNOSIS — E559 Vitamin D deficiency, unspecified: Secondary | ICD-10-CM | POA: Diagnosis not present

## 2023-03-14 DIAGNOSIS — E7800 Pure hypercholesterolemia, unspecified: Secondary | ICD-10-CM | POA: Diagnosis not present

## 2023-03-14 DIAGNOSIS — N182 Chronic kidney disease, stage 2 (mild): Secondary | ICD-10-CM | POA: Diagnosis not present

## 2023-03-15 ENCOUNTER — Other Ambulatory Visit (HOSPITAL_BASED_OUTPATIENT_CLINIC_OR_DEPARTMENT_OTHER): Payer: Self-pay

## 2023-03-16 ENCOUNTER — Other Ambulatory Visit (HOSPITAL_BASED_OUTPATIENT_CLINIC_OR_DEPARTMENT_OTHER): Payer: Self-pay

## 2023-03-16 MED ORDER — ERGOCALCIFEROL 1.25 MG (50000 UT) PO CAPS
50000.0000 [IU] | ORAL_CAPSULE | ORAL | 0 refills | Status: DC
Start: 2023-03-14 — End: 2023-09-24
  Filled 2023-03-16 – 2023-05-07 (×2): qty 4, 28d supply, fill #0

## 2023-03-16 MED ORDER — ZEPBOUND 5 MG/0.5ML ~~LOC~~ SOAJ
5.0000 mg | SUBCUTANEOUS | 0 refills | Status: DC
  Filled 2023-03-16: qty 2, 28d supply, fill #0

## 2023-03-19 ENCOUNTER — Other Ambulatory Visit (HOSPITAL_COMMUNITY): Payer: Self-pay

## 2023-03-24 DIAGNOSIS — Z125 Encounter for screening for malignant neoplasm of prostate: Secondary | ICD-10-CM | POA: Diagnosis not present

## 2023-03-24 DIAGNOSIS — Z0000 Encounter for general adult medical examination without abnormal findings: Secondary | ICD-10-CM | POA: Diagnosis not present

## 2023-03-30 DIAGNOSIS — Z0000 Encounter for general adult medical examination without abnormal findings: Secondary | ICD-10-CM | POA: Diagnosis not present

## 2023-03-30 DIAGNOSIS — Z23 Encounter for immunization: Secondary | ICD-10-CM | POA: Diagnosis not present

## 2023-03-30 DIAGNOSIS — Z1339 Encounter for screening examination for other mental health and behavioral disorders: Secondary | ICD-10-CM | POA: Diagnosis not present

## 2023-03-30 DIAGNOSIS — Z1331 Encounter for screening for depression: Secondary | ICD-10-CM | POA: Diagnosis not present

## 2023-04-13 ENCOUNTER — Other Ambulatory Visit (HOSPITAL_BASED_OUTPATIENT_CLINIC_OR_DEPARTMENT_OTHER): Payer: Self-pay

## 2023-04-13 MED ORDER — ERGOCALCIFEROL 1.25 MG (50000 UT) PO CAPS
50000.0000 [IU] | ORAL_CAPSULE | ORAL | 0 refills | Status: DC
  Filled 2023-04-13: qty 4, 28d supply, fill #0
  Filled 2023-06-11: qty 4, 28d supply, fill #1

## 2023-04-13 MED ORDER — ZEPBOUND 5 MG/0.5ML ~~LOC~~ SOAJ
SUBCUTANEOUS | 0 refills | Status: DC
  Filled 2023-04-13: qty 2, 28d supply, fill #0

## 2023-04-21 DIAGNOSIS — M778 Other enthesopathies, not elsewhere classified: Secondary | ICD-10-CM | POA: Diagnosis not present

## 2023-04-21 DIAGNOSIS — M79642 Pain in left hand: Secondary | ICD-10-CM | POA: Diagnosis not present

## 2023-04-21 DIAGNOSIS — G5603 Carpal tunnel syndrome, bilateral upper limbs: Secondary | ICD-10-CM | POA: Diagnosis not present

## 2023-04-23 DIAGNOSIS — G4733 Obstructive sleep apnea (adult) (pediatric): Secondary | ICD-10-CM | POA: Diagnosis not present

## 2023-05-07 ENCOUNTER — Other Ambulatory Visit: Payer: Self-pay

## 2023-05-07 ENCOUNTER — Other Ambulatory Visit (HOSPITAL_BASED_OUTPATIENT_CLINIC_OR_DEPARTMENT_OTHER): Payer: Self-pay

## 2023-05-09 DIAGNOSIS — E559 Vitamin D deficiency, unspecified: Secondary | ICD-10-CM | POA: Diagnosis not present

## 2023-05-09 DIAGNOSIS — E66812 Obesity, class 2: Secondary | ICD-10-CM | POA: Diagnosis not present

## 2023-05-09 DIAGNOSIS — Z6839 Body mass index (BMI) 39.0-39.9, adult: Secondary | ICD-10-CM | POA: Diagnosis not present

## 2023-05-09 DIAGNOSIS — N182 Chronic kidney disease, stage 2 (mild): Secondary | ICD-10-CM | POA: Diagnosis not present

## 2023-05-09 DIAGNOSIS — E7800 Pure hypercholesterolemia, unspecified: Secondary | ICD-10-CM | POA: Diagnosis not present

## 2023-05-11 ENCOUNTER — Other Ambulatory Visit (HOSPITAL_BASED_OUTPATIENT_CLINIC_OR_DEPARTMENT_OTHER): Payer: Self-pay

## 2023-05-11 MED ORDER — ZEPBOUND 5 MG/0.5ML ~~LOC~~ SOAJ
5.0000 mg | SUBCUTANEOUS | 0 refills | Status: DC
  Filled 2023-05-11: qty 2, 28d supply, fill #0

## 2023-05-11 MED ORDER — ERGOCALCIFEROL 1.25 MG (50000 UT) PO CAPS
50000.0000 [IU] | ORAL_CAPSULE | ORAL | 0 refills | Status: DC
Start: 2023-05-09 — End: 2023-09-18
  Filled 2023-05-11: qty 4, 28d supply, fill #0

## 2023-06-07 DIAGNOSIS — E66813 Obesity, class 3: Secondary | ICD-10-CM | POA: Diagnosis not present

## 2023-06-07 DIAGNOSIS — E559 Vitamin D deficiency, unspecified: Secondary | ICD-10-CM | POA: Diagnosis not present

## 2023-06-07 DIAGNOSIS — E7800 Pure hypercholesterolemia, unspecified: Secondary | ICD-10-CM | POA: Diagnosis not present

## 2023-06-07 DIAGNOSIS — Z6841 Body Mass Index (BMI) 40.0 and over, adult: Secondary | ICD-10-CM | POA: Diagnosis not present

## 2023-06-08 ENCOUNTER — Other Ambulatory Visit (HOSPITAL_BASED_OUTPATIENT_CLINIC_OR_DEPARTMENT_OTHER): Payer: Self-pay

## 2023-06-08 MED ORDER — ZEPBOUND 7.5 MG/0.5ML ~~LOC~~ SOAJ
7.5000 mg | SUBCUTANEOUS | 0 refills | Status: DC
  Filled 2023-06-08: qty 2, 28d supply, fill #0

## 2023-06-11 ENCOUNTER — Other Ambulatory Visit (HOSPITAL_BASED_OUTPATIENT_CLINIC_OR_DEPARTMENT_OTHER): Payer: Self-pay

## 2023-06-29 ENCOUNTER — Encounter: Payer: Self-pay | Admitting: Gastroenterology

## 2023-07-03 DIAGNOSIS — J069 Acute upper respiratory infection, unspecified: Secondary | ICD-10-CM | POA: Diagnosis not present

## 2023-07-03 DIAGNOSIS — R509 Fever, unspecified: Secondary | ICD-10-CM | POA: Diagnosis not present

## 2023-07-06 ENCOUNTER — Other Ambulatory Visit (HOSPITAL_BASED_OUTPATIENT_CLINIC_OR_DEPARTMENT_OTHER): Payer: Self-pay

## 2023-07-06 MED ORDER — ZEPBOUND 7.5 MG/0.5ML ~~LOC~~ SOAJ
7.5000 mg | SUBCUTANEOUS | 0 refills | Status: DC
  Filled 2023-07-06: qty 2, 28d supply, fill #0

## 2023-07-06 MED ORDER — ERGOCALCIFEROL 1.25 MG (50000 UT) PO CAPS
50000.0000 [IU] | ORAL_CAPSULE | ORAL | 0 refills | Status: DC
  Filled 2023-07-06: qty 4, 28d supply, fill #0

## 2023-07-24 ENCOUNTER — Other Ambulatory Visit (HOSPITAL_BASED_OUTPATIENT_CLINIC_OR_DEPARTMENT_OTHER): Payer: Self-pay

## 2023-07-24 DIAGNOSIS — R42 Dizziness and giddiness: Secondary | ICD-10-CM | POA: Diagnosis not present

## 2023-07-24 MED ORDER — ZEPBOUND 7.5 MG/0.5ML ~~LOC~~ SOAJ
7.5000 mg | SUBCUTANEOUS | 11 refills | Status: AC
  Filled 2023-07-24 – 2023-07-27 (×2): qty 2, 28d supply, fill #0

## 2023-07-24 MED ORDER — PREDNISONE 5 MG PO TABS
ORAL_TABLET | ORAL | 0 refills | Status: AC
  Filled 2023-07-24: qty 21, 6d supply, fill #0

## 2023-07-27 ENCOUNTER — Other Ambulatory Visit (HOSPITAL_BASED_OUTPATIENT_CLINIC_OR_DEPARTMENT_OTHER): Payer: Self-pay

## 2023-07-28 ENCOUNTER — Other Ambulatory Visit (HOSPITAL_BASED_OUTPATIENT_CLINIC_OR_DEPARTMENT_OTHER): Payer: Self-pay

## 2023-07-29 ENCOUNTER — Other Ambulatory Visit (HOSPITAL_BASED_OUTPATIENT_CLINIC_OR_DEPARTMENT_OTHER): Payer: Self-pay

## 2023-08-04 DIAGNOSIS — G5603 Carpal tunnel syndrome, bilateral upper limbs: Secondary | ICD-10-CM | POA: Diagnosis not present

## 2023-08-13 ENCOUNTER — Other Ambulatory Visit: Payer: Self-pay | Admitting: Orthopedic Surgery

## 2023-09-11 ENCOUNTER — Other Ambulatory Visit: Payer: Self-pay

## 2023-09-11 ENCOUNTER — Encounter (HOSPITAL_BASED_OUTPATIENT_CLINIC_OR_DEPARTMENT_OTHER): Payer: Self-pay | Admitting: Orthopedic Surgery

## 2023-09-11 NOTE — Progress Notes (Signed)
   09/11/23 1116  PAT Phone Screen  Is the patient taking a GLP-1 receptor agonist? (S)  Yes  Has the patient been informed on holding medication? (S)  Yes (3/3 last dose- will hold 3/10 dose)  Do You Have Diabetes? No  Do You Have Hypertension? No  Have You Ever Been to the ER for Asthma? No  Have You Taken Oral Steroids in the Past 3 Months? No  Do you Take Phenteramine or any Other Diet Drugs? No  Recent  Lab Work, EKG, CXR? No  Do you have a history of heart problems? No  Any Recent Hospitalizations? No  Height 5\' 9"  (1.753 m)  Weight 120.2 kg  Pat Appointment Scheduled No  Reason for No Appointment Not Needed

## 2023-09-18 ENCOUNTER — Ambulatory Visit (HOSPITAL_BASED_OUTPATIENT_CLINIC_OR_DEPARTMENT_OTHER): Admitting: Anesthesiology

## 2023-09-18 ENCOUNTER — Ambulatory Visit (HOSPITAL_BASED_OUTPATIENT_CLINIC_OR_DEPARTMENT_OTHER)
Admission: RE | Admit: 2023-09-18 | Discharge: 2023-09-18 | Disposition: A | Discharge status: Home / Self Care | Payer: BC Managed Care – PPO | Source: Ambulatory Visit | Attending: Orthopedic Surgery | Admitting: Orthopedic Surgery

## 2023-09-18 ENCOUNTER — Encounter (HOSPITAL_BASED_OUTPATIENT_CLINIC_OR_DEPARTMENT_OTHER)
Admission: RE | Disposition: A | Discharge status: Home / Self Care | Payer: Self-pay | Source: Ambulatory Visit | Attending: Orthopedic Surgery

## 2023-09-18 ENCOUNTER — Encounter (HOSPITAL_BASED_OUTPATIENT_CLINIC_OR_DEPARTMENT_OTHER): Payer: Self-pay | Admitting: Orthopedic Surgery

## 2023-09-18 ENCOUNTER — Other Ambulatory Visit: Payer: Self-pay

## 2023-09-18 DIAGNOSIS — J45909 Unspecified asthma, uncomplicated: Secondary | ICD-10-CM | POA: Diagnosis not present

## 2023-09-18 DIAGNOSIS — G4730 Sleep apnea, unspecified: Secondary | ICD-10-CM | POA: Insufficient documentation

## 2023-09-18 DIAGNOSIS — M1990 Unspecified osteoarthritis, unspecified site: Secondary | ICD-10-CM | POA: Insufficient documentation

## 2023-09-18 DIAGNOSIS — G5602 Carpal tunnel syndrome, left upper limb: Secondary | ICD-10-CM | POA: Diagnosis not present

## 2023-09-18 DIAGNOSIS — Z6839 Body mass index (BMI) 39.0-39.9, adult: Secondary | ICD-10-CM | POA: Diagnosis not present

## 2023-09-18 DIAGNOSIS — E66813 Obesity, class 3: Secondary | ICD-10-CM | POA: Diagnosis not present

## 2023-09-18 DIAGNOSIS — Z87891 Personal history of nicotine dependence: Secondary | ICD-10-CM | POA: Insufficient documentation

## 2023-09-18 DIAGNOSIS — G5603 Carpal tunnel syndrome, bilateral upper limbs: Secondary | ICD-10-CM | POA: Insufficient documentation

## 2023-09-18 DIAGNOSIS — Z8261 Family history of arthritis: Secondary | ICD-10-CM | POA: Diagnosis not present

## 2023-09-18 DIAGNOSIS — K219 Gastro-esophageal reflux disease without esophagitis: Secondary | ICD-10-CM | POA: Diagnosis not present

## 2023-09-18 HISTORY — PX: CARPAL TUNNEL RELEASE: SHX101

## 2023-09-18 SURGERY — CARPAL TUNNEL RELEASE
Anesthesia: General | Site: Hand | Laterality: Left

## 2023-09-18 MED ORDER — DEXAMETHASONE SODIUM PHOSPHATE 10 MG/ML IJ SOLN
INTRAMUSCULAR | Status: DC | PRN
  Administered 2023-09-18: 10 mg via INTRAVENOUS

## 2023-09-18 MED ORDER — MIDAZOLAM HCL 2 MG/2ML IJ SOLN
INTRAMUSCULAR | Status: AC
  Filled 2023-09-18: qty 2

## 2023-09-18 MED ORDER — FENTANYL CITRATE (PF) 100 MCG/2ML IJ SOLN
INTRAMUSCULAR | Status: AC
  Filled 2023-09-18: qty 2

## 2023-09-18 MED ORDER — BUPIVACAINE HCL (PF) 0.25 % IJ SOLN
INTRAMUSCULAR | Status: DC | PRN
  Administered 2023-09-18: 9 mL

## 2023-09-18 MED ORDER — PROPOFOL 10 MG/ML IV BOLUS
INTRAVENOUS | Status: DC | PRN
  Administered 2023-09-18: 200 mg via INTRAVENOUS
  Administered 2023-09-18: 100 ug/kg/min via INTRAVENOUS
  Administered 2023-09-18: 175 ug/kg/min via INTRAVENOUS

## 2023-09-18 MED ORDER — DEXAMETHASONE SODIUM PHOSPHATE 10 MG/ML IJ SOLN
INTRAMUSCULAR | Status: AC
  Filled 2023-09-18: qty 1

## 2023-09-18 MED ORDER — CEFAZOLIN SODIUM-DEXTROSE 2-4 GM/100ML-% IV SOLN
INTRAVENOUS | Status: AC
  Filled 2023-09-18: qty 100

## 2023-09-18 MED ORDER — 0.9 % SODIUM CHLORIDE (POUR BTL) OPTIME
TOPICAL | Status: DC | PRN
  Administered 2023-09-18: 1000 mL

## 2023-09-18 MED ORDER — FENTANYL CITRATE (PF) 100 MCG/2ML IJ SOLN: 25.0000 ug | INTRAMUSCULAR | Status: DC | PRN

## 2023-09-18 MED ORDER — LIDOCAINE 2% (20 MG/ML) 5 ML SYRINGE
INTRAMUSCULAR | Status: DC | PRN
  Administered 2023-09-18: 60 mg via INTRAVENOUS

## 2023-09-18 MED ORDER — LACTATED RINGERS IV SOLN: INTRAVENOUS | Status: DC

## 2023-09-18 MED ORDER — PROPOFOL 10 MG/ML IV BOLUS
INTRAVENOUS | Status: AC
  Filled 2023-09-18: qty 20

## 2023-09-18 MED ORDER — ONDANSETRON HCL 4 MG/2ML IJ SOLN
INTRAMUSCULAR | Status: DC | PRN
Start: 2023-09-18 — End: 2023-09-18
  Administered 2023-09-18: 4 mg via INTRAVENOUS

## 2023-09-18 MED ORDER — CEFAZOLIN SODIUM-DEXTROSE 2-4 GM/100ML-% IV SOLN
2.0000 g | INTRAVENOUS | Status: AC
  Administered 2023-09-18: 2 g via INTRAVENOUS

## 2023-09-18 MED ORDER — FENTANYL CITRATE (PF) 100 MCG/2ML IJ SOLN
INTRAMUSCULAR | Status: DC | PRN
  Administered 2023-09-18 (×2): 50 ug via INTRAVENOUS

## 2023-09-18 MED ORDER — ACETAMINOPHEN 500 MG PO TABS
ORAL_TABLET | ORAL | Status: AC
  Filled 2023-09-18: qty 2

## 2023-09-18 MED ORDER — MIDAZOLAM HCL 5 MG/5ML IJ SOLN
INTRAMUSCULAR | Status: DC | PRN
  Administered 2023-09-18: 2 mg via INTRAVENOUS

## 2023-09-18 MED ORDER — ACETAMINOPHEN 500 MG PO TABS
1000.0000 mg | ORAL_TABLET | Freq: Once | ORAL | Status: AC
  Administered 2023-09-18: 1000 mg via ORAL

## 2023-09-18 MED ORDER — ONDANSETRON HCL 4 MG/2ML IJ SOLN
INTRAMUSCULAR | Status: AC
  Filled 2023-09-18: qty 2

## 2023-09-18 MED ORDER — HYDROCODONE-ACETAMINOPHEN 5-325 MG PO TABS: 1.0000 | ORAL_TABLET | Freq: Four times a day (QID) | ORAL | 0 refills | Status: AC | PRN

## 2023-09-18 SURGICAL SUPPLY — 33 items
BLADE SURG 15 STRL LF DISP TIS (BLADE) ×2 IMPLANT
BNDG ELASTIC 3INX 5YD STR LF (GAUZE/BANDAGES/DRESSINGS) ×1 IMPLANT
BNDG ESMARK 4X9 LF (GAUZE/BANDAGES/DRESSINGS) IMPLANT
BNDG GAUZE DERMACEA FLUFF 4 (GAUZE/BANDAGES/DRESSINGS) ×1 IMPLANT
CHLORAPREP W/TINT 26 (MISCELLANEOUS) ×1 IMPLANT
CORD BIPOLAR FORCEPS 12FT (ELECTRODE) ×1 IMPLANT
COVER BACK TABLE 60X90IN (DRAPES) ×1 IMPLANT
COVER MAYO STAND STRL (DRAPES) ×1 IMPLANT
CUFF TOURN SGL QUICK 18X4 (TOURNIQUET CUFF) ×1 IMPLANT
DRAPE EXTREMITY T 121X128X90 (DISPOSABLE) ×1 IMPLANT
DRAPE SURG 17X23 STRL (DRAPES) ×1 IMPLANT
GAUZE PAD ABD 8X10 STRL (GAUZE/BANDAGES/DRESSINGS) ×1 IMPLANT
GAUZE SPONGE 4X4 12PLY STRL (GAUZE/BANDAGES/DRESSINGS) ×1 IMPLANT
GAUZE XEROFORM 1X8 LF (GAUZE/BANDAGES/DRESSINGS) ×1 IMPLANT
GLOVE BIO SURGEON STRL SZ7.5 (GLOVE) ×1 IMPLANT
GLOVE BIOGEL PI IND STRL 7.0 (GLOVE) IMPLANT
GLOVE BIOGEL PI IND STRL 8 (GLOVE) ×1 IMPLANT
GLOVE SURG SS PI 7.0 STRL IVOR (GLOVE) IMPLANT
GOWN STRL REUS W/ TWL LRG LVL3 (GOWN DISPOSABLE) ×1 IMPLANT
GOWN STRL REUS W/TWL LRG LVL3 (GOWN DISPOSABLE) IMPLANT
GOWN STRL REUS W/TWL XL LVL3 (GOWN DISPOSABLE) ×1 IMPLANT
NDL HYPO 25X1 1.5 SAFETY (NEEDLE) ×1 IMPLANT
NEEDLE HYPO 25X1 1.5 SAFETY (NEEDLE) ×1 IMPLANT
NS IRRIG 1000ML POUR BTL (IV SOLUTION) ×1 IMPLANT
PACK BASIN DAY SURGERY FS (CUSTOM PROCEDURE TRAY) ×1 IMPLANT
PADDING CAST ABS COTTON 4X4 ST (CAST SUPPLIES) ×1 IMPLANT
SPIKE FLUID TRANSFER (MISCELLANEOUS) IMPLANT
STOCKINETTE 4X48 STRL (DRAPES) ×1 IMPLANT
SUT ETHILON 4 0 PS 2 18 (SUTURE) ×1 IMPLANT
SYR BULB EAR ULCER 3OZ GRN STR (SYRINGE) ×1 IMPLANT
SYR CONTROL 10ML LL (SYRINGE) ×1 IMPLANT
TOWEL GREEN STERILE FF (TOWEL DISPOSABLE) ×2 IMPLANT
UNDERPAD 30X36 HEAVY ABSORB (UNDERPADS AND DIAPERS) ×1 IMPLANT

## 2023-09-18 NOTE — Anesthesia Procedure Notes (Signed)
 Procedure Name: LMA Insertion Date/Time: 09/18/2023 2:12 PM  Performed by: Roosvelt Harps, CRNAPre-anesthesia Checklist: Patient identified, Emergency Drugs available, Suction available and Patient being monitored Patient Re-evaluated:Patient Re-evaluated prior to induction Oxygen Delivery Method: Circle System Utilized Preoxygenation: Pre-oxygenation with 100% oxygen Induction Type: IV induction Ventilation: Mask ventilation without difficulty LMA: LMA inserted LMA Size: 5.0 Number of attempts: 1 Airway Equipment and Method: Bite block Placement Confirmation: positive ETCO2 Tube secured with: Tape Dental Injury: Teeth and Oropharynx as per pre-operative assessment

## 2023-09-18 NOTE — H&P (Signed)
 James Webster is an 48 y.o. male.   Chief Complaint: carpal tunnel syndrome HPI: 48 y.o. yo male with numbness and tingling left hand.  Nocturnal symptoms. Positive nerve conduction studies. He wishes to have left carpal tunnel release.   Allergies: No Known Allergies  Past Medical History:  Diagnosis Date   Allergy    SEASONAL   Asthma    WITH ALLERGIES   Carpal tunnel syndrome on both sides    DDD (degenerative disc disease), lumbosacral 11/08/2015   GERD (gastroesophageal reflux disease)    "SOMETIMES"   Plantar fasciitis, bilateral    Sleep apnea     Past Surgical History:  Procedure Laterality Date   COLONOSCOPY     15 PLUS YEARS AGO IN [place],UPDATED 05/27/22   TUBES IN EARS     AS A CHILD    Family History: Family History  Problem Relation Age of Onset   Arthritis Mother    Gallbladder disease Mother    Healthy Father    Gallbladder disease Sister    Cancer Maternal Aunt    Heart disease Maternal Grandfather    Colon cancer Neg Hx    Stomach cancer Neg Hx    Rectal cancer Neg Hx    Esophageal cancer Neg Hx    Liver cancer Neg Hx    Colon polyps Neg Hx    Crohn's disease Neg Hx    Ulcerative colitis Neg Hx     Social History:   reports that he quit smoking about 27 years ago. His smoking use included cigarettes. He started smoking about 30 years ago. He has a 3 pack-year smoking history. He has been exposed to tobacco smoke. He has never used smokeless tobacco. He reports current alcohol use. He reports that he does not use drugs.  Medications: Facility-Administered Medications Prior to Admission  Medication Dose Route Frequency Provider Last Rate Last Admin   0.9 %  sodium chloride infusion  500 mL Intravenous Once Nandigam, Eleonore Chiquito, MD       Medications Prior to Admission  Medication Sig Dispense Refill   cetirizine (ZYRTEC) 10 MG tablet Take 10 mg by mouth daily.     cyclobenzaprine (FLEXERIL) 10 MG tablet TAKE 1 TABLET BY MOUTH UP TO THREE TIMES  DAILY AT BEDTIME AS NEEDED     ergocalciferol (VITAMIN D2) 1.25 MG (50000 UT) capsule Take 1 capsule (50,000 Units total) by mouth once a week. 12 capsule 0   Fexofenadine HCl (ALLEGRA PO) Take 1 tablet by mouth in the morning. Alternates with zyrtec     Multiple Vitamins-Minerals (MULTIVITAMIN ADULT PO) Take by mouth.     albuterol (VENTOLIN HFA) 108 (90 Base) MCG/ACT inhaler      ergocalciferol (VITAMIN D2) 1.25 MG (50000 UT) capsule Take 1 capsule (50,000 Units total) by mouth once a week. 12 capsule 0   ergocalciferol (VITAMIN D2) 1.25 MG (50000 UT) capsule Take 1 capsule (50,000 Units total) by mouth once a week. 12 capsule 0   ergocalciferol (VITAMIN D2) 1.25 MG (50000 UT) capsule Take 1 capsule (50,000 Units total) by mouth once a week. 12 capsule 0   HYDROcodone-acetaminophen (NORCO/VICODIN) 5-325 MG tablet  (Patient not taking: Reported on 10/20/2022)     tirzepatide (ZEPBOUND) 2.5 MG/0.5ML Pen Inject 2.5 mg into the skin once a week. 2 mL 0   tirzepatide (ZEPBOUND) 2.5 MG/0.5ML Pen Inject 2.5 mg into the skin once a week. 2 mL 0   tirzepatide (ZEPBOUND) 5 MG/0.5ML Pen Inject 5 mg into  the skin once a week. 2 mL 0   tirzepatide (ZEPBOUND) 7.5 MG/0.5ML Pen Inject 7.5 mg into the skin once a week. 2 mL 11   Vitamin D, Ergocalciferol, (DRISDOL) 1.25 MG (50000 UNIT) CAPS capsule Take 1 capsule (50,000 Units total) by mouth once a week. 12 capsule 0    No results found for this or any previous visit (from the past 48 hours).  No results found.    Blood pressure 115/62, pulse 80, temperature 97.9 F (36.6 C), temperature source Oral, resp. rate 16, height 5\' 9"  (1.753 m), weight 121.1 kg, SpO2 99%.  General appearance: alert, cooperative, and appears stated age Head: Normocephalic, without obvious abnormality, atraumatic Neck: supple, symmetrical, trachea midline Extremities: Intact sensation and capillary refill all digits.  +epl/fpl/io.  No wounds.  Skin: Skin color, texture, turgor  normal. No rashes or lesions Neurologic: Grossly normal Incision/Wound: none  Assessment/Plan Left carpal tunnel syndrome.  Non operative and operative treatment options have been discussed with the patient and patient wishes to proceed with operative treatment. Risks, benefits, and alternatives of surgery have been discussed and the patient agrees with the plan of care.   Betha Loa 09/18/2023, 1:05 PM

## 2023-09-18 NOTE — Anesthesia Postprocedure Evaluation (Signed)
 Anesthesia Post Note  Patient: James Webster  Procedure(s) Performed: LEFT CARPAL TUNNEL RELEASE (Left: Hand)     Patient location during evaluation: PACU Anesthesia Type: General Level of consciousness: awake and alert Pain management: pain level controlled Vital Signs Assessment: post-procedure vital signs reviewed and stable Respiratory status: spontaneous breathing, nonlabored ventilation and respiratory function stable Cardiovascular status: blood pressure returned to baseline and stable Postop Assessment: no apparent nausea or vomiting Anesthetic complications: no  No notable events documented.  Last Vitals:  Vitals:   09/18/23 1500 09/18/23 1511  BP: 106/70 103/67  Pulse: 79 70  Resp: 18 18  Temp:  (!) 36.2 C  SpO2: 92% 94%    Last Pain:  Vitals:   09/18/23 1511  TempSrc: Temporal  PainSc: 0-No pain                 Calista Crain,W. EDMOND

## 2023-09-18 NOTE — Transfer of Care (Signed)
 Immediate Anesthesia Transfer of Care Note  Patient: Ridhaan Dreibelbis  Procedure(s) Performed: LEFT CARPAL TUNNEL RELEASE (Left: Hand)  Patient Location: PACU  Anesthesia Type:General  Level of Consciousness: drowsy  Airway & Oxygen Therapy: Patient Spontanous Breathing and Patient connected to face mask oxygen  Post-op Assessment: Report given to RN and Post -op Vital signs reviewed and stable  Post vital signs: Reviewed and stable  Last Vitals:  Vitals Value Taken Time  BP 96/64 09/18/23 1441  Temp    Pulse 76 09/18/23 1443  Resp 11 09/18/23 1443  SpO2 95 % 09/18/23 1443  Vitals shown include unfiled device data.  Last Pain:  Vitals:   09/18/23 1240  TempSrc:   PainSc: 0-No pain      Patients Stated Pain Goal: 3 (09/18/23 1235)  Complications: No notable events documented.

## 2023-09-18 NOTE — Anesthesia Preprocedure Evaluation (Addendum)
 Anesthesia Evaluation  Patient identified by MRN, date of birth, ID band Patient awake    Reviewed: Allergy & Precautions, H&P , NPO status , Patient's Chart, lab work & pertinent test results  Airway Mallampati: II  TM Distance: >3 FB Neck ROM: Full    Dental no notable dental hx. (+) Teeth Intact, Dental Advisory Given   Pulmonary asthma , sleep apnea and Continuous Positive Airway Pressure Ventilation , former smoker   Pulmonary exam normal breath sounds clear to auscultation       Cardiovascular negative cardio ROS  Rhythm:Regular Rate:Normal     Neuro/Psych negative neurological ROS  negative psych ROS   GI/Hepatic Neg liver ROS,GERD  Medicated,,  Endo/Other    Class 3 obesity  Renal/GU negative Renal ROS  negative genitourinary   Musculoskeletal  (+) Arthritis , Osteoarthritis,    Abdominal   Peds  Hematology negative hematology ROS (+)   Anesthesia Other Findings   Reproductive/Obstetrics negative OB ROS                             Anesthesia Physical Anesthesia Plan  ASA: 3  Anesthesia Plan: General   Post-op Pain Management: Tylenol PO (pre-op)*   Induction: Intravenous  PONV Risk Score and Plan: 3 and Ondansetron, Propofol infusion, TIVA and Dexamethasone  Airway Management Planned: LMA  Additional Equipment:   Intra-op Plan:   Post-operative Plan: Extubation in OR  Informed Consent: I have reviewed the patients History and Physical, chart, labs and discussed the procedure including the risks, benefits and alternatives for the proposed anesthesia with the patient or authorized representative who has indicated his/her understanding and acceptance.     Dental advisory given  Plan Discussed with: CRNA  Anesthesia Plan Comments:        Anesthesia Quick Evaluation

## 2023-09-18 NOTE — Op Note (Addendum)
 09/18/2023 [facility]                              OPERATIVE REPORT   PREOPERATIVE DIAGNOSIS:  Left carpal tunnel syndrome  POSTOPERATIVE DIAGNOSIS:  Left carpal tunnel syndrome  PROCEDURE:  Left carpal tunnel release  SURGEON:  Betha Loa, MD  ASSISTANT:  none.  ANESTHESIA: General  IV FLUIDS:  Per anesthesia flow sheet  ESTIMATED BLOOD LOSS:  Minimal  COMPLICATIONS:  None  SPECIMENS:  None  TOURNIQUET TIME:    Total Tourniquet Time Documented: Upper Arm (Left) - 11 minutes Total: Upper Arm (Left) - 11 minutes   DISPOSITION:  Stable to PACU  LOCATION: [facility]  INDICATIONS:  48 y.o. yo male with numbness and tingling left hand.  Nocturnal symptoms. Positive nerve conduction studies. He wishes to proceed with left carpal tunnel release.  Risks, benefits and alternatives of surgery were discussed including the risk of blood loss; infection; damage to nerves, vessels, tendons, ligaments, bone; failure of surgery; need for additional surgery; complications with wound healing; continued pain; recurrence of carpal tunnel syndrome; and damage to motor branch. He voiced understanding of these risks and elected to proceed.   OPERATIVE COURSE:  After being identified preoperatively by myself, the patient and I agreed upon the procedure and site of procedure.  The surgical site was marked.  Surgical consent had been signed.  He was given IV Ancef as preoperative antibiotic prophylaxis.  He was transferred to the operating room and placed on the operating room table in supine position with the Left upper extremity on an armboard.  General anesthesia was induced by the anesthesiologist.  Left upper extremity was prepped and draped in normal sterile orthopaedic fashion.  A surgical pause was performed between the surgeons, anesthesia, and operating room staff, and all were in agreement as to the patient, procedure, and site of procedure.  Tourniquet at the  proximal aspect of the extremity was inflated to 250 mmHg after exsanguination of the arm with an Esmarch bandage  Incision was made over the transverse carpal ligament and carried into the subcutaneous tissues by spreading technique.  Bipolar electrocautery was used to obtain hemostasis.  The palmar fascia was sharply incised.  The transverse carpal ligament was identified.  The fascia distal to the ligament was opened.  Retractor was placed and the flexor tendons were identified.  The flexor tendon to the ring finger was identified and retracted radially.  The transverse carpal ligament was then incised from distal to proximal under direct visualization.  Scissors were used to split the distal aspect of the volar antebrachial fascia.  A finger was placed into the wound to ensure complete decompression, which was the case.  The nerve was examined.  It was flattened and hyperemic. It was adherent to the radial leaflet.  The motor branch was identified and was intact.  The wound was copiously irrigated with sterile saline.  It was then closed with 4-0 nylon in a horizontal mattress fashion.  It was injected with 0.25% plain Marcaine to aid in postoperative analgesia.  It was dressed with sterile Xeroform, 4x4s, an ABD, and wrapped with Kerlix and an Ace bandage.  Tourniquet was deflated at 11 minutes.  Fingertips were pink with brisk capillary refill after deflation of the tourniquet.  Operative drapes were broken down.  The patient was awoken from anesthesia safely.  He was transferred back to stretcher and taken  to the PACU in stable condition.  I will see him back in the office in 1 week for postoperative followup.  I will give him a prescription for Norco 5/325 1 tab PO q6 hours prn pain, dispense # 15.    Betha Loa, MD Electronically signed, 09/18/23

## 2023-09-18 NOTE — Discharge Instructions (Addendum)
 [facility] Instructions Hand Surgery  Wound Care: Keep your hand elevated above the level of your heart.  Do not allow it to dangle by your side.  Keep the dressing dry and do not remove it unless your doctor advises you to do so.  He will usually change it at the time of your post-op visit.  Moving your fingers is advised to stimulate circulation but will depend on the site of your surgery.  If you have a splint applied, your doctor will advise you regarding movement.  Activity: Do not drive or operate machinery today.  Rest today and then you may return to your normal activity and work as indicated by your physician.  Diet:  Drink liquids today or eat a light diet.  You may resume a regular diet tomorrow.    General expectations: Pain for two to three days. Fingers may become slightly swollen.  Call your doctor if any of the following occur: Severe pain not relieved by pain medication. Elevated temperature. Dressing soaked with blood. Inability to move fingers. White or bluish color to fingers.    Post Anesthesia Home Care Instructions  Activity: Get plenty of rest for the remainder of the day. A responsible individual must stay with you for 24 hours following the procedure.  For the next 24 hours, DO NOT: -Drive a car -Advertising copywriter -Drink alcoholic beverages -Take any medication unless instructed by your physician -Make any legal decisions or sign important papers.  Meals: Start with liquid foods such as gelatin or soup. Progress to regular foods as tolerated. Avoid greasy, spicy, heavy foods. If nausea and/or vomiting occur, drink only clear liquids until the nausea and/or vomiting subsides. Call your physician if vomiting continues.  Special Instructions/Symptoms: Your throat may feel dry or sore from the anesthesia or the breathing tube placed in your throat during surgery. If this causes discomfort, gargle with warm salt water. The discomfort should disappear  within 24 hours.  If you had a scopolamine patch placed behind your ear for the management of post- operative nausea and/or vomiting:  1. The medication in the patch is effective for 72 hours, after which it should be removed.  Wrap patch in a tissue and discard in the trash. Wash hands thoroughly with soap and water. 2. You may remove the patch earlier than 72 hours if you experience unpleasant side effects which may include dry mouth, dizziness or visual disturbances. 3. Avoid touching the patch. Wash your hands with soap and water after contact with the patch.     Last received tylenol at 1240pm

## 2023-09-19 ENCOUNTER — Encounter (HOSPITAL_BASED_OUTPATIENT_CLINIC_OR_DEPARTMENT_OTHER): Payer: Self-pay | Admitting: Orthopedic Surgery

## 2023-09-24 ENCOUNTER — Encounter: Payer: Self-pay | Admitting: Gastroenterology

## 2023-09-24 ENCOUNTER — Ambulatory Visit: Payer: BC Managed Care – PPO | Admitting: Gastroenterology

## 2023-09-24 VITALS — BP 110/66 | HR 88 | Ht 68.0 in | Wt 271.5 lb

## 2023-09-24 DIAGNOSIS — R12 Heartburn: Secondary | ICD-10-CM

## 2023-09-24 DIAGNOSIS — K219 Gastro-esophageal reflux disease without esophagitis: Secondary | ICD-10-CM

## 2023-09-24 MED ORDER — OMEPRAZOLE 20 MG PO CPDR: 20.0000 mg | DELAYED_RELEASE_CAPSULE | Freq: Every day | ORAL | 3 refills | Status: DC

## 2023-09-24 NOTE — Progress Notes (Signed)
 James Webster    [id number]    12-17-1975  Primary Care Physician:Alysia Penna, MD  Referring Physician: Alysia Penna, MD [address] [place],  [place] [address]   Chief complaint:  GERD  Discussed the use of AI scribe software for clinical note transcription with the patient, who gave verbal consent to proceed.  History of Present Illness   James Webster is a 48 year old male who presents with new onset acid reflux symptoms.  He has been experiencing new onset acid reflux symptoms for the past couple of months, characterized by a burning sensation that has woken him up from sleep on several occasions. No associated coughing, shortness of breath, or vomiting. The symptoms have been persistent and more frequent than previous episodes of heartburn he has experienced in the past.  He initially sought care from his primary care physician due to the persistence of symptoms and was prescribed omeprazole, which he has been taking once daily in the morning. Since starting the medication, he reports significant improvement, stating he hasn't had an issue regardless of dietary intake.  He mentions that certain foods, such as [place] spices and heavy tomato sauces, have previously triggered symptoms. No difficulty swallowing, dark stools, stomach pains, or changes in bowel habits.  He has experienced weight loss, noting a decrease from over 300 pounds to approximately 268 pounds, which he attributes to intentional efforts.  His family history includes a sister who had complications from untreated reflux, leading to scar tissue removal.     CT abdomen & pelvis Negative. No CT evidence for acute intra-abdominal or pelvic abnormality  Colonoscopy 06/19/22 - Three 4 to 6 mm polyps in the rectum, removed with a cold snare. Resected and retrieved. - Non- bleeding internal hemorrhoids.  Surgical [P], colon, rectum, polyp (3) - HYPERPLASTIC POLYPS WITH PROLAPSE  EFFECT.  Outpatient Encounter Medications as of 09/24/2023  Medication Sig   cetirizine (ZYRTEC) 10 MG tablet Take 10 mg by mouth daily.   cyclobenzaprine (FLEXERIL) 10 MG tablet Take 10 mg by mouth as needed.   Fexofenadine HCl (ALLEGRA PO) Take 1 tablet by mouth in the morning. Alternates with zyrtec   HYDROcodone-acetaminophen (NORCO/VICODIN) 5-325 MG tablet Take 1 tablet by mouth every 6 (six) hours as needed for moderate pain (pain score 4-6).   Multiple Vitamins-Minerals (MULTIVITAMIN ADULT PO) Take by mouth.   omeprazole (PRILOSEC) 20 MG capsule Take 1 capsule (20 mg total) by mouth daily.   tirzepatide (ZEPBOUND) 7.5 MG/0.5ML Pen Inject 7.5 mg into the skin once a week.   [DISCONTINUED] albuterol (VENTOLIN HFA) 108 (90 Base) MCG/ACT inhaler    [DISCONTINUED] ergocalciferol (VITAMIN D2) 1.25 MG (50000 UT) capsule Take 1 capsule (50,000 Units total) by mouth once a week.   [DISCONTINUED] ergocalciferol (VITAMIN D2) 1.25 MG (50000 UT) capsule Take 1 capsule (50,000 Units total) by mouth once a week.   [DISCONTINUED] ergocalciferol (VITAMIN D2) 1.25 MG (50000 UT) capsule Take 1 capsule (50,000 Units total) by mouth once a week.   [DISCONTINUED] tirzepatide (ZEPBOUND) 2.5 MG/0.5ML Pen Inject 2.5 mg into the skin once a week.   [DISCONTINUED] tirzepatide (ZEPBOUND) 2.5 MG/0.5ML Pen Inject 2.5 mg into the skin once a week.   [DISCONTINUED] tirzepatide (ZEPBOUND) 5 MG/0.5ML Pen Inject 5 mg into the skin once a week.   [DISCONTINUED] Vitamin D, Ergocalciferol, (DRISDOL) 1.25 MG (50000 UNIT) CAPS capsule Take 1 capsule (50,000 Units total) by mouth once a week.   Facility-Administered Encounter  Medications as of 09/24/2023  Medication   0.9 %  sodium chloride infusion    Allergies as of 09/24/2023   (No Known Allergies)    Past Medical History:  Diagnosis Date   Allergy    SEASONAL   Asthma    WITH ALLERGIES   Carpal tunnel syndrome on both sides    DDD (degenerative disc disease),  lumbosacral 11/08/2015   GERD (gastroesophageal reflux disease)    "SOMETIMES"   Plantar fasciitis, bilateral    Sleep apnea     Past Surgical History:  Procedure Laterality Date   CARPAL TUNNEL RELEASE Left 09/18/2023   Procedure: LEFT CARPAL TUNNEL RELEASE;  Surgeon: Betha Loa, MD;  Location: [facility];  Service: Orthopedics;  Laterality: Left;  30 MIN   COLONOSCOPY     15 PLUS YEARS AGO IN [place],UPDATED 05/27/22   TUBES IN EARS     AS A CHILD    Family History  Problem Relation Age of Onset   Arthritis Mother    Gallbladder disease Mother    Healthy Father    Gallbladder disease Sister    Cancer Maternal Aunt    Heart disease Maternal Grandfather    Colon cancer Neg Hx    Stomach cancer Neg Hx    Rectal cancer Neg Hx    Esophageal cancer Neg Hx    Liver cancer Neg Hx    Colon polyps Neg Hx    Crohn's disease Neg Hx    Ulcerative colitis Neg Hx     Social History   Socioeconomic History   Marital status: Married    Spouse name: Tammy   Number of children: 0   Years of education: college   Highest education level: Not on file  Occupational History   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[employer], building services  Tobacco Use   Smoking status: Former    Current packs/day: 0.00    Average packs/day: 1 pack/day for 3.0 years (3.0 ttl pk-yrs)    Types: Cigarettes    Start date: 05/08/1993    Quit date: 05/08/1996    Years since quitting: 27.3    Passive exposure: Past   Smokeless tobacco: Never  Vaping Use   Vaping status: Never Used  Substance and Sexual Activity   Alcohol use: Yes    Comment: OCCASSIONALY   Drug use: No   Sexual activity: Not on file  Other Topics Concern   Not on file  Social History Narrative   Drinks about 1/2 pot of regular coffee a day, and earlier in the day   Left handed   [employer] employee   Social Drivers of Health   Financial Resource Strain: Not on file  Food Insecurity: Low Risk  (08/04/2023)   Received from [facility]    Hunger Vital Sign    Worried About Running Out of Food in the Last Year: Never true    Ran Out of Food in the Last Year: Never true  Transportation Needs: No Transportation Needs (08/04/2023)   Received from [facility]    In the past 12 months, has lack of reliable transportation kept you from medical appointments, meetings, work or from getting things needed for daily living? : No  Physical Activity: Not on file  Stress: Not on file  Social Connections: Unknown (07/06/2022)   Received from [facility]   Social Network    Social Network: Not on file  Intimate Partner Violence: Unknown (07/06/2022)   [facility]   HITS    Physically Hurt: Not on file    Insult or Talk Down To: Not on file    Threaten Physical Harm: Not on file    Scream or Curse: Not on file      Review of systems: All other review of systems negative except as mentioned in the HPI.   Physical Exam: Vitals:   09/24/23 0841  BP: 110/66  Pulse: 88   Body mass index is 41.28 kg/m. Gen:      No acute distress HEENT:  sclera anicteric Neuro: alert and oriented x 3 Psych: normal mood and affect  Data Reviewed:  Reviewed labs, radiology imaging, old records and pertinent past GI work up     Assessment and Plan    Gastroesophageal Reflux Disease (GERD) Persistent reflux symptoms for several months, characterized by nocturnal burning sensation. Symptoms improved with omeprazole 40 mg daily. No cough, dyspnea, dysphagia, melena, abdominal pain, or emesis. Differential includes esophagitis or erosions (superficial ulcers) due to reflux. Discussed potential for esophageal irritation or ulcers and the importance of ruling out hernia or precancerous changes. Emphasized need for upper endoscopy to assess esophagus and guide long-term management. Discussed risks of long-term acid reducer use and goal to minimize medication. Explained persistent  reflux can lead to esophageal changes, including risk of precancerous changes, necessitating careful monitoring. - Order upper endoscopy to evaluate esophagus. - Reduce omeprazole to 20 mg daily. - Advise to take omeprazole before breakfast or dinner. - Instruct to increase dose to 40 mg if symptoms recur. - Discuss potential triggers such as heavy foods and alcohol. - Educate on importance of monitoring for symptom changes.   -Schedule upper endoscopy for June 16 at 7:00 AM. Planning to travel and may have a new job involving travel, affecting scheduling.  - Advised to call to reschedule if new job or travel plans interfere.       The patient was provided an opportunity to ask questions and all were answered. The patient agreed with the plan and demonstrated an understanding of the instructions.  Iona Beard , MD    CC: Alysia Penna, MD

## 2023-09-24 NOTE — Patient Instructions (Addendum)
 VISIT SUMMARY:  Today, we discussed your recent onset of acid reflux symptoms, which have been persistent and more frequent than your previous episodes of heartburn. You have been taking omeprazole with significant improvement. We reviewed your symptoms, potential triggers, and the importance of further evaluation.  YOUR PLAN:  -GASTROESOPHAGEAL REFLUX DISEASE (GERD): GERD is a condition where stomach acid frequently flows back into the tube connecting your mouth and stomach, causing irritation. We discussed the need for an upper endoscopy to evaluate your esophagus and guide long-term management. You should reduce your omeprazole dose to 20 mg daily, taken before breakfast or dinner, and increase to 40 mg if symptoms recur. Avoid potential triggers like heavy foods and alcohol, and monitor for any changes in your symptoms.  INSTRUCTIONS:  Please schedule your upper endoscopy for June 16 at 7:00 AM. If your new job or travel plans interfere, reschedule the appointment as needed.  You have been scheduled for an endoscopy. Please follow written instructions given to you at your visit today.  If you use inhalers (even only as needed), please bring them with you on the day of your procedure.  If you take any of the following medications, they will need to be adjusted prior to your procedure:   DO NOT TAKE 7 DAYS PRIOR TO TEST- Trulicity (dulaglutide) Ozempic, Wegovy (semaglutide) Mounjaro (tirzepatide) Bydureon Bcise (exanatide extended release)  DO NOT TAKE 1 DAY PRIOR TO YOUR TEST Rybelsus (semaglutide) Adlyxin (lixisenatide) Victoza (liraglutide) Byetta (exanatide) ___________________________________________________________________________    Due to recent changes in healthcare laws, you may see the results of your imaging and laboratory studies on MyChart before your provider has had a chance to review them.  We understand that in some cases there may be results that are confusing or  concerning to you. Not all laboratory results come back in the same time frame and the provider may be waiting for multiple results in order to interpret others.  Please give [place] 48 hours in order for your provider to thoroughly review all the results before contacting the office for clarification of your results.    I appreciate the  opportunity to care for you  Thank You   Marsa Aris , MD

## 2023-09-25 DIAGNOSIS — G5603 Carpal tunnel syndrome, bilateral upper limbs: Secondary | ICD-10-CM | POA: Diagnosis not present

## 2023-09-30 ENCOUNTER — Encounter: Payer: Self-pay | Admitting: Gastroenterology

## 2023-10-02 DIAGNOSIS — G5603 Carpal tunnel syndrome, bilateral upper limbs: Secondary | ICD-10-CM | POA: Diagnosis not present

## 2023-11-03 DIAGNOSIS — G4733 Obstructive sleep apnea (adult) (pediatric): Secondary | ICD-10-CM | POA: Diagnosis not present

## 2023-11-25 DIAGNOSIS — G5603 Carpal tunnel syndrome, bilateral upper limbs: Secondary | ICD-10-CM | POA: Diagnosis not present

## 2023-12-01 ENCOUNTER — Telehealth: Payer: Self-pay | Admitting: *Deleted

## 2023-12-01 NOTE — Telephone Encounter (Signed)
 Pt last seen 10-20-2022.  Needs a follow up appt.

## 2023-12-03 NOTE — Telephone Encounter (Signed)
 Noted.  Orders from [facility] in inbox for Megan NP to sign on.

## 2023-12-03 NOTE — Telephone Encounter (Signed)
 Pt called to schedule appt. And preferred to do a VV.  Pt was asked if there have been any changes or if he had any concerns that needed to be addressed and pt answered no to both questions.

## 2023-12-06 NOTE — Progress Notes (Unsigned)
 PATIENT: James Webster DOB: 10-Mar-1976  REASON FOR VISIT: follow up HISTORY FROM: patient  No chief complaint on file.    HISTORY OF PRESENT ILLNESS: Today 12/06/23:  Santo Zahradnik is a 48 y.o. male with a history of OSA on CPAP. Returns today for follow-up.      10/20/22: Kristain Hu is a 48 y.o. male with a history of obstructive sleep apnea. Returns today for follow-up.  He reports he has not been using the CPAP machine in quite some time.  States that he was unable to adjust to the mask.  He states that he is extremely sleepy and he stops breathing at night which scared his wife.  He is questioning alternative treatments.  He also reports that he has been and insomniac his entire life.  He is currently working 3 jobs.  Typically does not go to bed till after midnight and will wake up around 4 AM.  He states that once he wakes up he is unable to go back to sleep.  His daytime job is [employer].  He does drink caffeine-usually a half a pot of coffee in the morning and then 1 cup of coffee around 4 PM.     05/10/20: Mr. Kozakiewicz is a 48 year old male with a history of obstructive sleep apnea on CPAP.  His download indicates that he uses machine 11 out of 30 days for compliance of 37%.  He uses machine greater than 4 hours each night.  On average he uses it 5 hours and 46 minutes.  His residual AHI is 3.3 on 7 to 11 cm of water with EPR 3.  Leak in the 95th percentile is 37.1 L/min.  He reports that he is unable to use the CPAP.  Reports that he has tried it for 3 years and cannot tolerate it.  He reports that has not improved his symptoms at all.  He tried 3 different mask with no benefit.  He does note that he has had weight gain since the last visit.  He returns today for an evaluation.  HISTORY Mr. Grieger is a 48 year old male with a history of obstructive sleep apnea on CPAP.  His download indicates that he uses machine 23 out of 30 days for compliance of 77%.  He uses  machine greater than 4 hours 21 days for compliance of 70%.  On average he uses his machine 5 hours and 47 minutes.  Residual AHI is 1.7 on 7 to 11 cm of water with EPR of 3.  Leak in the 95th percentile is 10.5.  He reports that the mask was made helpful  REVIEW OF SYSTEMS: Out of a complete 14 system review of symptoms, the patient complains only of the following symptoms, and all other reviewed systems are negative.  FSS 44 ESS 12  ALLERGIES: No Known Allergies  HOME MEDICATIONS: Outpatient Medications Prior to Visit  Medication Sig Dispense Refill   cetirizine (ZYRTEC) 10 MG tablet Take 10 mg by mouth daily.     cyclobenzaprine (FLEXERIL) 10 MG tablet Take 10 mg by mouth as needed.     Fexofenadine HCl (ALLEGRA PO) Take 1 tablet by mouth in the morning. Alternates with zyrtec     HYDROcodone -acetaminophen  (NORCO/VICODIN) 5-325 MG tablet Take 1 tablet by mouth every 6 (six) hours as needed for moderate pain (pain score 4-6). 15 tablet 0   Multiple Vitamins-Minerals (MULTIVITAMIN ADULT PO) Take by mouth.     omeprazole  (PRILOSEC) 20 MG capsule Take 1 capsule (20  mg total) by mouth daily. 90 capsule 3   tirzepatide  (ZEPBOUND ) 7.5 MG/0.5ML Pen Inject 7.5 mg into the skin once a week. 2 mL 11   Facility-Administered Medications Prior to Visit  Medication Dose Route Frequency Provider Last Rate Last Admin   0.9 %  sodium chloride  infusion  500 mL Intravenous Once Nandigam, Kavitha V, MD        PAST MEDICAL HISTORY: Past Medical History:  Diagnosis Date   Allergy    SEASONAL   Asthma    WITH ALLERGIES   Carpal tunnel syndrome on both sides    DDD (degenerative disc disease), lumbosacral 11/08/2015   GERD (gastroesophageal reflux disease)    "SOMETIMES"   Plantar fasciitis, bilateral    Sleep apnea     PAST SURGICAL HISTORY: Past Surgical History:  Procedure Laterality Date   CARPAL TUNNEL RELEASE Left 09/18/2023   Procedure: LEFT CARPAL TUNNEL RELEASE;  Surgeon: Brunilda Capra,  MD;  Location: [facility];  Service: Orthopedics;  Laterality: Left;  30 MIN   COLONOSCOPY     15 PLUS YEARS AGO IN [place] ,UPDATED 05/27/22   TUBES IN EARS     AS A CHILD    FAMILY HISTORY: Family History  Problem Relation Age of Onset   Arthritis Mother    Gallbladder disease Mother    Healthy Father    Gallbladder disease Sister    Cancer Maternal Aunt    Heart disease Maternal Grandfather    Colon cancer Neg Hx    Stomach cancer Neg Hx    Rectal cancer Neg Hx    Esophageal cancer Neg Hx    Liver cancer Neg Hx    Colon polyps Neg Hx    Crohn's disease Neg Hx    Ulcerative colitis Neg Hx     SOCIAL HISTORY: Social History   Socioeconomic History   Marital status: Married    Spouse name: Tammy   Number of children: 0   Years of education: college   Highest education level: Not on file  Occupational History   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[employer], building services  Tobacco Use   Smoking status: Former    Current packs/day: 0.00    Average packs/day: 1 pack/day for 3.0 years (3.0 ttl pk-yrs)    Types: Cigarettes    Start date: 05/08/1993    Quit date: 05/08/1996    Years since quitting: 27.5    Passive exposure: Past   Smokeless tobacco: Never  Vaping Use   Vaping status: Never Used  Substance and Sexual Activity   Alcohol  use: Yes    Comment: OCCASSIONALY   Drug use: No   Sexual activity: Not on file  Other Topics Concern   Not on file  Social History Narrative   Drinks about 1/2 pot of regular coffee a day, and earlier in the day   Left handed   [employer] employee   Social Drivers of Health   Financial Resource Strain: Not on file  Food Insecurity: Low Risk  (10/02/2023)   Received from [facility]   Hunger Vital Sign    Worried About Running Out of Food in the Last Year: Never true    Ran Out of Food in the Last Year: Never true  Transportation Needs: No Transportation Needs (10/02/2023)   Received from [facility]    In the past 12  months, has lack of reliable transportation kept you from medical appointments, meetings, work or from getting things needed for daily  living? : No  Physical Activity: Not on file  Stress: Not on file  Social Connections: Unknown (07/06/2022)   Received from [facility], [facility]   Social Network    Social Network: Not on file  Intimate Partner Violence: Unknown (07/06/2022)   Received from [facility], [facility]   HITS    Physically Hurt: Not on file    Insult or Talk Down To: Not on file    Threaten Physical Harm: Not on file    Scream or Curse: Not on file      PHYSICAL EXAM  There were no vitals filed for this visit.   There is no height or weight on file to calculate BMI.  Generalized: Well developed, in no acute distress    Neurological examination  Mentation: Alert oriented to time, place, history taking. Follows all commands speech and language fluent     DIAGNOSTIC DATA (LABS, IMAGING, TESTING) - I reviewed patient records, labs, notes, testing and imaging myself where available.  Lab Results  Component Value Date   WBC 11.3 (H) 05/12/2019   HGB 16.9 05/12/2019   HCT 49.9 05/12/2019   MCV 86.5 05/12/2019   PLT 273 05/12/2019      Component Value Date/Time   NA 136 05/12/2019 1528   K 3.6 05/12/2019 1528   CL 103 05/12/2019 1528   CO2 20 (L) 05/12/2019 1528   GLUCOSE 95 05/12/2019 1528   BUN 14 05/12/2019 1528   CREATININE 1.23 05/12/2019 1528   CALCIUM 9.2 05/12/2019 1528   PROT 7.4 05/12/2019 2100   ALBUMIN 4.2 05/12/2019 2100   AST 21 05/12/2019 2100   ALT 28 05/12/2019 2100   ALKPHOS 76 05/12/2019 2100   BILITOT 1.0 05/12/2019 2100   GFRNONAA >60 05/12/2019 1528   GFRAA >60 05/12/2019 1528     ASSESSMENT AND PLAN 48 y.o. year old male  has a past medical history of Allergy, Asthma, Carpal tunnel syndrome on both sides, DDD (degenerative disc disease), lumbosacral (11/08/2015), GERD (gastroesophageal reflux disease), Plantar  fasciitis, bilateral, and Sleep apnea. here with:  OSA on CPAP   I reviewed different treatment options in detail.  We reviewed the inspire device which the patient is not interested in at this time.  We also reviewed dental device however the patient does not think he would be able to tolerate this andhe is also concerned about out-of-pocket cost.  He is willing to retry CPAP with a different DME company.  We will try [facility].  First repeat a home sleep test and pending results we will send a new order to [facility].  Also advised that if he is compliant with CPAP therapy but is still struggling with insomnia we can consider adding on a sleep aid in the future.    Clem Currier, MSN, NP-C 12/06/2023, 2:13 PM [facility] [address], [address] [place], [place] [address] [phone]

## 2023-12-07 ENCOUNTER — Encounter: Payer: Self-pay | Admitting: *Deleted

## 2023-12-08 ENCOUNTER — Telehealth (INDEPENDENT_AMBULATORY_CARE_PROVIDER_SITE_OTHER): Admitting: Adult Health

## 2023-12-08 DIAGNOSIS — G4733 Obstructive sleep apnea (adult) (pediatric): Secondary | ICD-10-CM

## 2023-12-08 NOTE — Progress Notes (Signed)
 PATIENT: James Webster DOB: 05/21/1976  REASON FOR VISIT: follow up HISTORY FROM: patient PRIMARY NEUROLOGIST: Dr. Omar Bibber   Virtual Visit via Video Note  I connected with James Webster on 12/08/23 at  3:15 PM EDT by a video enabled telemedicine application located remotely at [facility] and verified that I am speaking with the correct person using two identifiers who was located at their own home in [place]   I discussed the limitations of evaluation and management by telemedicine and the availability of in person appointments. The patient expressed understanding and agreed to proceed.   PATIENT: James Webster DOB: October 02, 1975  REASON FOR VISIT: follow up HISTORY FROM: patient  HISTORY OF PRESENT ILLNESS: Today 12/08/23:  James Webster is a 48 y.o. male with a history of OSA on CPAP. Returns today for follow-up. Reports that he has been using the CPAP. Reports that it does irritate irritate his nose.  He has tried several different masks in the past.  He continues to want to try with the CPAP.  He does have cream that he rubs on his nose.  Download is below       10/20/22: James Webster is a 48 y.o. male with a history of obstructive sleep apnea. Returns today for follow-up.  He reports he has not been using the CPAP machine in quite some time.  States that he was unable to adjust to the mask.  He states that he is extremely sleepy and he stops breathing at night which scared his wife.  He is questioning alternative treatments.  He also reports that he has been and insomniac his entire life.  He is currently working 3 jobs.  Typically does not go to bed till after midnight and will wake up around 4 AM.  He states that once he wakes up he is unable to go back to sleep.  His daytime job is [employer].  He does drink caffeine-usually a half a pot of coffee in the morning and then 1 cup of coffee around 4 PM.  05/10/20: [name] is a 48 year old male with a  history of obstructive sleep apnea on CPAP.  His download indicates that he uses machine 11 out of 30 days for compliance of 37%.  He uses machine greater than 4 hours each night.  On average he uses it 5 hours and 46 minutes.  His residual AHI is 3.3 on 7 to 11 cm of water with EPR 3.  Leak in the 95th percentile is 37.1 L/min.  He reports that he is unable to use the CPAP.  Reports that he has tried it for 3 years and cannot tolerate it.  He reports that has not improved his symptoms at all.  He tried 3 different mask with no benefit.  He does note that he has had weight gain since the last visit.  He returns today for an evaluation.  HISTORY [name] is a 48 year old male with a history of obstructive sleep apnea on CPAP.  His download indicates that he uses machine 23 out of 30 days for compliance of 77%.  He uses machine greater than 4 hours 21 days for compliance of 70%.  On average he uses his machine 5 hours and 47 minutes.  Residual AHI is 1.7 on 7 to 11 cm of water with EPR of 3.  Leak in the 95th percentile is 10.5.  He reports that the mask was made helpful  REVIEW OF SYSTEMS: Out of a complete  14 system review of symptoms, the patient complains only of the following symptoms, and all other reviewed systems are negative.  ALLERGIES: No Known Allergies  HOME MEDICATIONS: Outpatient Medications Prior to Visit  Medication Sig Dispense Refill   cetirizine (ZYRTEC) 10 MG tablet Take 10 mg by mouth daily.     cyclobenzaprine (FLEXERIL) 10 MG tablet Take 10 mg by mouth as needed.     Fexofenadine HCl (ALLEGRA PO) Take 1 tablet by mouth in the morning. Alternates with zyrtec     HYDROcodone -acetaminophen  (NORCO/VICODIN) 5-325 MG tablet Take 1 tablet by mouth every 6 (six) hours as needed for moderate pain (pain score 4-6). 15 tablet 0   Multiple Vitamins-Minerals (MULTIVITAMIN ADULT PO) Take by mouth.     omeprazole  (PRILOSEC) 20 MG capsule Take 1 capsule (20 mg total) by mouth daily. 90  capsule 3   tirzepatide  (ZEPBOUND ) 7.5 MG/0.5ML Pen Inject 7.5 mg into the skin once a week. 2 mL 11   Facility-Administered Medications Prior to Visit  Medication Dose Route Frequency Provider Last Rate Last Admin   0.9 %  sodium chloride  infusion  500 mL Intravenous Once Nandigam, Kavitha V, MD        PAST MEDICAL HISTORY: Past Medical History:  Diagnosis Date   Allergy    SEASONAL   Asthma    WITH ALLERGIES   Carpal tunnel syndrome on both sides    DDD (degenerative disc disease), lumbosacral 11/08/2015   GERD (gastroesophageal reflux disease)    "SOMETIMES"   Plantar fasciitis, bilateral    Sleep apnea     PAST SURGICAL HISTORY: Past Surgical History:  Procedure Laterality Date   CARPAL TUNNEL RELEASE Left 09/18/2023   Procedure: LEFT CARPAL TUNNEL RELEASE;  Surgeon: Brunilda Capra, MD;  Location:  SUR[facility]ERY [facility];  Service: Orthopedics;  Laterality: Left;  30 MIN   COLONOSCOPY     15 PLUS YEARS AGO IN [place] ,UPDATED 05/27/22   TUBES IN EARS     AS A CHILD    FAMILY HISTORY: Family History  Problem Relation Age of Onset   Arthritis Mother    Gallbladder disease Mother    Healthy Father    Gallbladder disease Sister    Cancer Maternal Aunt    Heart disease Maternal Grandfather    Colon cancer Neg Hx    Stomach cancer Neg Hx    Rectal cancer Neg Hx    Esophageal cancer Neg Hx    Liver cancer Neg Hx    Colon polyps Neg Hx    Crohn's disease Neg Hx    Ulcerative colitis Neg Hx     SOCIAL HISTORY: Social History   Socioeconomic History   Marital status: Married    Spouse name: Tammy   Number of children: 0   Years of education: college   Highest education level: Not on file  Occupational History   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[employer], building services  Tobacco Use   Smoking status: Former    Current packs/day: 0.00    Average packs/day: 1 pack/day for 3.0 years (3.0 ttl pk-yrs)    Types: Cigarettes    Start date: 05/08/1993    Quit date: 05/08/1996    Years since  quitting: 27.6    Passive exposure: Past   Smokeless tobacco: Never  Vaping Use   Vaping status: Never Used  Substance and Sexual Activity   Alcohol  use: Yes    Comment: OCCASSIONALY   Drug use: No   Sexual activity: Not on file  Other Topics Concern  Not on file  Social History Narrative   Drinks about 1/2 pot of regular coffee a day, and earlier in the day   Left handed   [employer] employee   Social Drivers of Health   Financial Resource Strain: Not on file  Food Insecurity: Low Risk  (10/02/2023)   Received from [facility]   Hunger Vital Sign    Worried About Running Out of Food in the Last Year: Never true    Ran Out of Food in the Last Year: Never true  Transportation Needs: No Transportation Needs (10/02/2023)   Received from [facility]    In the past 12 months, has lack of reliable transportation kept you from medical appointments, meetings, work or from getting things needed for daily living? : No  Physical Activity: Not on file  Stress: Not on file  Social Connections: Unknown (07/06/2022)   Received from [facility], [facility]   Social Network    Social Network: Not on file  Intimate Partner Violence: Unknown (07/06/2022)   Received from [facility], [facility]   HITS    Physically Hurt: Not on file    Insult or Talk Down To: Not on file    Threaten Physical Harm: Not on file    Scream or Curse: Not on file      PHYSICAL EXAM Generalized: Well developed, in no acute distress   Neurological examination  Mentation: Alert oriented to time, place, history taking. Follows all commands speech and language fluent Cranial nerve II-XII: Facial symmetry noted.   DIAGNOSTIC DATA (LABS, IMAGING, TESTING) - I reviewed patient records, labs, notes, testing and imaging myself where available.  Lab Results  Component Value Date   WBC 11.3 (H) 05/12/2019   HGB 16.9 05/12/2019   HCT 49.9 05/12/2019   MCV 86.5 05/12/2019    PLT 273 05/12/2019      Component Value Date/Time   NA 136 05/12/2019 1528   K 3.6 05/12/2019 1528   CL 103 05/12/2019 1528   CO2 20 (L) 05/12/2019 1528   GLUCOSE 95 05/12/2019 1528   BUN 14 05/12/2019 1528   CREATININE 1.23 05/12/2019 1528   CALCIUM 9.2 05/12/2019 1528   PROT 7.4 05/12/2019 2100   ALBUMIN 4.2 05/12/2019 2100   AST 21 05/12/2019 2100   ALT 28 05/12/2019 2100   ALKPHOS 76 05/12/2019 2100   BILITOT 1.0 05/12/2019 2100   GFRNONAA >60 05/12/2019 1528   GFRAA >60 05/12/2019 1528      ASSESSMENT AND PLAN 49 y.o. year old male  has a past medical history of Allergy, Asthma, Carpal tunnel syndrome on both sides, DDD (degenerative disc disease), lumbosacral (11/08/2015), GERD (gastroesophageal reflux disease), Plantar fasciitis, bilateral, and Sleep apnea. here with:  OSA on CPAP  CPAP compliance suboptimal Residual AHI is good Encouraged patient to continue using CPAP nightly and > 4 hours each night F/U in 1 year or sooner if needed      Clem Currier, MSN, NP-C 12/08/2023, 3:44 PM [facility] [address] [place], [place] [address] [phone]

## 2023-12-09 ENCOUNTER — Encounter: Payer: Self-pay | Admitting: Gastroenterology

## 2023-12-15 DIAGNOSIS — G4733 Obstructive sleep apnea (adult) (pediatric): Secondary | ICD-10-CM | POA: Diagnosis not present

## 2023-12-21 ENCOUNTER — Encounter: Payer: Self-pay | Admitting: Gastroenterology

## 2023-12-21 ENCOUNTER — Ambulatory Visit (AMBULATORY_SURGERY_CENTER): Admitting: Gastroenterology

## 2023-12-21 VITALS — BP 105/61 | HR 75 | Temp 98.4°F | Resp 14 | Ht 68.0 in | Wt 271.0 lb

## 2023-12-21 DIAGNOSIS — K319 Disease of stomach and duodenum, unspecified: Secondary | ICD-10-CM

## 2023-12-21 DIAGNOSIS — K2100 Gastro-esophageal reflux disease with esophagitis, without bleeding: Secondary | ICD-10-CM | POA: Diagnosis not present

## 2023-12-21 DIAGNOSIS — K2970 Gastritis, unspecified, without bleeding: Secondary | ICD-10-CM

## 2023-12-21 DIAGNOSIS — K219 Gastro-esophageal reflux disease without esophagitis: Secondary | ICD-10-CM | POA: Diagnosis not present

## 2023-12-21 MED ORDER — SODIUM CHLORIDE 0.9 % IV SOLN: 500.0000 mL | Freq: Once | INTRAVENOUS | Status: DC

## 2023-12-21 NOTE — Progress Notes (Unsigned)
 [facility] History and Physical   Primary Care Physician:  Barnetta Liberty, MD   Reason for Procedure:  GERD, long term, recurrent despite treatment  Plan:    EGD with possible interventions as needed     HPI: James Webster is a very pleasant 48 y.o. male here for EGD for evaluation of GERD, long term, recurrent despite treatment . Denies any nausea, vomiting, abdominal pain, melena or bright red blood per rectum  The risks and benefits as well as alternatives of endoscopic procedure(s) have been discussed and reviewed. All questions answered. The patient agrees to proceed.    Past Medical History:  Diagnosis Date   Allergy    SEASONAL   Asthma    WITH ALLERGIES   Carpal tunnel syndrome on both sides    DDD (degenerative disc disease), lumbosacral 11/08/2015   GERD (gastroesophageal reflux disease)    SOMETIMES   Plantar fasciitis, bilateral    Sleep apnea     Past Surgical History:  Procedure Laterality Date   CARPAL TUNNEL RELEASE Left 09/18/2023   Procedure: LEFT CARPAL TUNNEL RELEASE;  Surgeon: Brunilda Capra, MD;  Location: [facility];  Service: Orthopedics;  Laterality: Left;  30 MIN   COLONOSCOPY     15 PLUS YEARS AGO IN [place] ,UPDATED 05/27/22   TUBES IN EARS     AS A CHILD    Prior to Admission medications   Medication Sig Start Date End Date Taking? Authorizing Provider  cetirizine (ZYRTEC) 10 MG tablet Take 10 mg by mouth daily.   Yes [provider]  Fexofenadine HCl (ALLEGRA PO) Take 1 tablet by mouth in the morning. Alternates with zyrtec   Yes [provider]  Multiple Vitamins-Minerals (MULTIVITAMIN ADULT PO) Take by mouth.   Yes [provider]  omeprazole  (PRILOSEC) 20 MG capsule Take 1 capsule (20 mg total) by mouth daily. 09/24/23  Yes Abagale Boulos V, MD  cyclobenzaprine (FLEXERIL) 10 MG tablet Take 10 mg by mouth as needed. 02/04/21   [provider]  HYDROcodone -acetaminophen   (NORCO/VICODIN) 5-325 MG tablet Take 1 tablet by mouth every 6 (six) hours as needed for moderate pain (pain score 4-6). 09/18/23   Kuzma, Kevin, MD  tirzepatide  (ZEPBOUND ) 7.5 MG/0.5ML Pen Inject 7.5 mg into the skin once a week. 07/24/23       Current Outpatient Medications  Medication Sig Dispense Refill   cetirizine (ZYRTEC) 10 MG tablet Take 10 mg by mouth daily.     Fexofenadine HCl (ALLEGRA PO) Take 1 tablet by mouth in the morning. Alternates with zyrtec     Multiple Vitamins-Minerals (MULTIVITAMIN ADULT PO) Take by mouth.     omeprazole  (PRILOSEC) 20 MG capsule Take 1 capsule (20 mg total) by mouth daily. 90 capsule 3   cyclobenzaprine (FLEXERIL) 10 MG tablet Take 10 mg by mouth as needed.     HYDROcodone -acetaminophen  (NORCO/VICODIN) 5-325 MG tablet Take 1 tablet by mouth every 6 (six) hours as needed for moderate pain (pain score 4-6). 15 tablet 0   tirzepatide  (ZEPBOUND ) 7.5 MG/0.5ML Pen Inject 7.5 mg into the skin once a week. 2 mL 11   Current Facility-Administered Medications  Medication Dose Route Frequency Provider Last Rate Last Admin   0.9 %  sodium chloride  infusion  500 mL Intravenous Once Shanquita Ronning V, MD       0.9 %  sodium chloride  infusion  500 mL Intravenous Once Gaius Ishaq V, MD        Allergies as of 12/21/2023   (  No Known Allergies)    Family History  Problem Relation Age of Onset   Arthritis Mother    Gallbladder disease Mother    Healthy Father    Gallbladder disease Sister    Cancer Maternal Aunt    Heart disease Maternal Grandfather    Colon cancer Neg Hx    Stomach cancer Neg Hx    Rectal cancer Neg Hx    Esophageal cancer Neg Hx    Liver cancer Neg Hx    Colon polyps Neg Hx    Crohn's disease Neg Hx    Ulcerative colitis Neg Hx     Social History   Socioeconomic History   Marital status: Married    Spouse name: Tammy   Number of children: 0   Years of education: college   Highest education level: Not on file   Occupational History   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[employer], building services  Tobacco Use   Smoking status: Former    Current packs/day: 0.00    Average packs/day: 1 pack/day for 3.0 years (3.0 ttl pk-yrs)    Types: Cigarettes    Start date: 05/08/1993    Quit date: 05/08/1996    Years since quitting: 27.6    Passive exposure: Past   Smokeless tobacco: Never  Vaping Use   Vaping status: Never Used  Substance and Sexual Activity   Alcohol  use: Yes    Comment: OCCASSIONALY   Drug use: No   Sexual activity: Not on file  Other Topics Concern   Not on file  Social History Narrative   Drinks about 1/2 pot of regular coffee a day, and earlier in the day   Left handed   [employer] employee   Social Drivers of Health   Financial Resource Strain: Not on file  Food Insecurity: Low Risk  (10/02/2023)   Received from [facility]   Hunger Vital Sign    Within the past 12 months, you worried that your food would run out before you got money to buy more: Never true    Within the past 12 months, the food you bought just didn't last and you didn't have money to get more. : Never true  Transportation Needs: No Transportation Needs (10/02/2023)   Received from [facility]    In the past 12 months, has lack of reliable transportation kept you from medical appointments, meetings, work or from getting things needed for daily living? : No  Physical Activity: Not on file  Stress: Not on file  Social Connections: Unknown (07/06/2022)   Received from [facility]   Social Network    Social Network: Not on file  Intimate Partner Violence: Unknown (07/06/2022)   Received from [facility]   HITS    Physically Hurt: Not on file    Insult or Talk Down To: Not on file    Threaten Physical Harm: Not on file    Scream or Curse: Not on file    Review of Systems:  All other review of systems negative except as mentioned in the HPI.  Physical Exam: Vital signs in last 24 hours: BP (!) 111/8    Pulse 80   Temp 98.4 F (36.9 C) (Temporal)   Ht 5' 8 (1.727 m)   Wt 271 lb (122.9 kg)   SpO2 97%   BMI 41.21 kg/m  General:   Alert, NAD Lungs:  Clear .   Heart:  Regular rate and rhythm Abdomen:  Soft, nontender and nondistended. Neuro/Psych:  Alert and cooperative. Normal mood and affect. A and O x 3  Reviewed labs, radiology imaging, old records and pertinent past GI work up  Patient is appropriate for planned procedure(s) and anesthesia in an ambulatory setting   K. Veena Srihith Aquilino , MD [phone]

## 2023-12-21 NOTE — Progress Notes (Unsigned)
 Vss nad trans to pacu

## 2023-12-21 NOTE — Op Note (Signed)
 [facility] Patient Name: James Webster Procedure Date: 12/21/2023 8:06 AM MRN: [id number] Endoscopist: Sergio Dandy , MD, [id number] Age: 48 Referring MD:  Date of Birth: 1976/02/03 Gender: Male Account #: [id number] Procedure:                Upper GI endoscopy Indications:              Suspected reflux esophagitis, Follow-up of                            gastro-esophageal reflux disease, Esophageal reflux                            symptoms that recur despite appropriate therapy Medicines:                Monitored Anesthesia Care Procedure:                Pre-Anesthesia Assessment:                           - Prior to the procedure, a History and Physical                            was performed, and patient medications and                            allergies were reviewed. The patient's tolerance of                            previous anesthesia was also reviewed. The risks                            and benefits of the procedure and the sedation                            options and risks were discussed with the patient.                            All questions were answered, and informed consent                            was obtained. Prior Anticoagulants: The patient has                            taken no anticoagulant or antiplatelet agents. ASA                            Grade Assessment: III - A patient with severe                            systemic disease. After reviewing the risks and                            benefits, the patient was deemed in satisfactory  condition to undergo the procedure.                           After obtaining informed consent, the endoscope was                            passed under direct vision. Throughout the                            procedure, the patient's blood pressure, pulse, and                            oxygen saturations were monitored continuously. The                             GIF [id number] #[id number] was introduced through the                            mouth, and advanced to the second part of duodenum.                            The upper GI endoscopy was accomplished without                            difficulty. The patient tolerated the procedure                            well. Scope In: Scope Out: Findings:                 LA Grade B (one or more mucosal breaks greater than                            5 mm, not extending between the tops of two mucosal                            folds) esophagitis with no bleeding was found 38 to                            40 cm from the incisors.                           The gastroesophageal flap valve was visualized                            endoscopically and classified as Hill Grade II                            (fold present, opens with respiration).                           Patchy mild inflammation characterized by erosions,                            erythema and friability  was found in the gastric                            antrum, in the prepyloric region of the stomach and                            at the pylorus. Biopsies were taken with a cold                            forceps for Helicobacter pylori testing.                           The cardia and gastric fundus were normal on                            retroflexion.                           The examined duodenum was normal. Complications:            No immediate complications. Estimated Blood Loss:     Estimated blood loss was minimal. Impression:               - LA Grade B reflux esophagitis with no bleeding.                           - Gastroesophageal flap valve classified as Hill                            Grade II (fold present, opens with respiration).                           - Gastritis. Biopsied.                           - Normal examined duodenum. Recommendation:           - Patient has a contact number available for                             emergencies. The signs and symptoms of potential                            delayed complications were discussed with the                            patient. Return to normal activities tomorrow.                            Written discharge instructions were provided to the                            patient.                           - Resume previous diet.                           -  Continue present medications.                           - Await pathology results.                           - Follow an antireflux regimen.                           - Continue Omeprazole  Sergio Dandy, MD 12/21/2023 8:34:08 AM This report has been signed electronically.

## 2023-12-21 NOTE — Progress Notes (Signed)
 rev

## 2023-12-21 NOTE — Progress Notes (Unsigned)
 Called to room to assist during endoscopic procedure.  Patient ID and intended procedure confirmed with present staff. Received instructions for my participation in the procedure from the performing physician.

## 2023-12-21 NOTE — Patient Instructions (Addendum)
-  Handout on gastritis provided -Continue Omeprazole  -Await pathology results  YOU HAD AN ENDOSCOPIC PROCEDURE TODAY AT THE [facility]:   Refer to the procedure report that was given to you for any specific questions about what was found during the examination.  If the procedure report does not answer your questions, please call your gastroenterologist to clarify.  If you requested that your care partner not be given the details of your procedure findings, then the procedure report has been included in a sealed envelope for you to review at your convenience later.  YOU SHOULD EXPECT: Some feelings of bloating in the abdomen. Passage of more gas than usual.  Walking can help get rid of the air that was put into your GI tract during the procedure and reduce the bloating. If you had a lower endoscopy (such as a colonoscopy or flexible sigmoidoscopy) you may notice spotting of blood in your stool or on the toilet paper. If you underwent a bowel prep for your procedure, you may not have a normal bowel movement for a few days.  Please Note:  You might notice some irritation and congestion in your nose or some drainage.  This is from the oxygen used during your procedure.  There is no need for concern and it should clear up in a day or so.  SYMPTOMS TO REPORT IMMEDIATELY:  Following upper endoscopy (EGD)  Vomiting of blood or coffee ground material  New chest pain or pain under the shoulder blades  Painful or persistently difficult swallowing  New shortness of breath  Fever of 100F or higher  Black, tarry-looking stools  For urgent or emergent issues, a gastroenterologist can be reached at any hour by calling [phone]. Do not use MyChart messaging for urgent concerns.    DIET:  We do recommend a small meal at first, but then you may proceed to your regular diet.  Drink plenty of fluids but you should avoid alcoholic beverages for 24 hours.  ACTIVITY:  You should plan to take it  easy for the rest of today and you should NOT DRIVE or use heavy machinery until tomorrow (because of the sedation medicines used during the test).    FOLLOW UP: Our staff will call the number listed on your records the next business day following your procedure.  We will call around 7:15- 8:00 am to check on you and address any questions or concerns that you may have regarding the information given to you following your procedure. If we do not reach you, we will leave a message.     If any biopsies were taken you will be contacted by phone or by letter within the next 1-3 weeks.  Please call us  at [phone] if you have not heard about the biopsies in 3 weeks.    SIGNATURES/CONFIDENTIALITY: You and/or your care partner have signed paperwork which will be entered into your electronic medical record.  These signatures attest to the fact that that the information above on your After Visit Summary has been reviewed and is understood.  Full responsibility of the confidentiality of this discharge information lies with you and/or your care-partner.

## 2023-12-22 ENCOUNTER — Telehealth: Payer: Self-pay | Admitting: *Deleted

## 2023-12-22 NOTE — Telephone Encounter (Signed)
 Left message on f/u call

## 2023-12-23 LAB — SURGICAL PATHOLOGY

## 2024-01-21 ENCOUNTER — Encounter: Payer: Self-pay | Admitting: Gastroenterology

## 2024-01-21 MED ORDER — OMEPRAZOLE 20 MG PO CPDR
20.0000 mg | DELAYED_RELEASE_CAPSULE | Freq: Every day | ORAL | 3 refills | Status: AC
Start: 2024-01-21 — End: ?

## 2024-02-19 ENCOUNTER — Ambulatory Visit: Payer: Self-pay | Admitting: Gastroenterology

## 2024-03-10 DIAGNOSIS — G4733 Obstructive sleep apnea (adult) (pediatric): Secondary | ICD-10-CM | POA: Diagnosis not present

## 2024-03-16 DIAGNOSIS — H40013 Open angle with borderline findings, low risk, bilateral: Secondary | ICD-10-CM | POA: Diagnosis not present

## 2024-03-25 DIAGNOSIS — Z125 Encounter for screening for malignant neoplasm of prostate: Secondary | ICD-10-CM | POA: Diagnosis not present

## 2024-03-25 DIAGNOSIS — Z0189 Encounter for other specified special examinations: Secondary | ICD-10-CM | POA: Diagnosis not present

## 2024-04-01 DIAGNOSIS — M533 Sacrococcygeal disorders, not elsewhere classified: Secondary | ICD-10-CM | POA: Diagnosis not present

## 2024-04-01 DIAGNOSIS — Z1331 Encounter for screening for depression: Secondary | ICD-10-CM | POA: Diagnosis not present

## 2024-04-01 DIAGNOSIS — Z0000 Encounter for general adult medical examination without abnormal findings: Secondary | ICD-10-CM | POA: Diagnosis not present

## 2024-04-01 DIAGNOSIS — Z1339 Encounter for screening examination for other mental health and behavioral disorders: Secondary | ICD-10-CM | POA: Diagnosis not present

## 2024-04-01 DIAGNOSIS — Z23 Encounter for immunization: Secondary | ICD-10-CM | POA: Diagnosis not present

## 2024-06-21 ENCOUNTER — Telehealth: Admitting: Adult Health

## 2024-06-22 ENCOUNTER — Telehealth: Admitting: Adult Health

## 2024-06-22 DIAGNOSIS — G4733 Obstructive sleep apnea (adult) (pediatric): Secondary | ICD-10-CM | POA: Diagnosis not present

## 2024-06-22 NOTE — Progress Notes (Signed)
 PATIENT: James Webster DOB: January 10, 1976  REASON FOR VISIT: follow up HISTORY FROM: patient PRIMARY NEUROLOGIST: Dr. Buck   Virtual Visit via Video Note  I connected with James Webster on 06/22/2024 at  3:00 PM EST by a video enabled telemedicine application located remotely at [facility] and verified that I am speaking with the correct person using two identifiers who was located at their own home in [place]   I discussed the limitations of evaluation and management by telemedicine and the availability of in person appointments. The patient expressed understanding and agreed to proceed.   PATIENT: James Webster DOB: 25-Nov-1975  REASON FOR VISIT: follow up HISTORY FROM: patient  HISTORY OF PRESENT ILLNESS: Today 06/22/2024:  James Webster is a 48 y.o. male with a history of obstructive sleep apnea on CPAP. Returns today for follow-up.  He reports that the CPAP is working well.  He states he has not been able to use it for the last couple nights because he has a infected hair on his nose.  He states that he recently switched to the DreamWear mask and that he tolerates better.  His download is below     12/08/23: James Webster is a 48 y.o. male with a history of OSA on CPAP. Returns today for follow-up. Reports that he has been using the CPAP. Reports that it does irritate irritate his nose.  He has tried several different masks in the past.  He continues to want to try with the CPAP.  He does have cream that he rubs on his nose.  Download is below       10/20/22: James Webster is a 48 y.o. male with a history of obstructive sleep apnea. Returns today for follow-up.  He reports he has not been using the CPAP machine in quite some time.  States that he was unable to adjust to the mask.  He states that he is extremely sleepy and he stops breathing at night which scared his wife.  He is questioning alternative treatments.  He also reports that he has  been and insomniac his entire life.  He is currently working 3 jobs.  Typically does not go to bed till after midnight and will wake up around 4 AM.  He states that once he wakes up he is unable to go back to sleep.  His daytime job is [employer].  He does drink caffeine-usually a half a pot of coffee in the morning and then 1 cup of coffee around 4 PM.  05/10/20: James Webster is a 48 year old male with a history of obstructive sleep apnea on CPAP.  His download indicates that he uses machine 11 out of 30 days for compliance of 37%.  He uses machine greater than 4 hours each night.  On average he uses it 5 hours and 46 minutes.  His residual AHI is 3.3 on 7 to 11 cm of water with EPR 3.  Leak in the 95th percentile is 37.1 L/min.  He reports that he is unable to use the CPAP.  Reports that he has tried it for 3 years and cannot tolerate it.  He reports that has not improved his symptoms at all.  He tried 3 different mask with no benefit.  He does note that he has had weight gain since the last visit.  He returns today for an evaluation.  HISTORY James Webster is a 48 year old male with a history of obstructive sleep apnea on CPAP.  His download indicates  that he uses machine 23 out of 30 days for compliance of 77%.  He uses machine greater than 4 hours 21 days for compliance of 70%.  On average he uses his machine 5 hours and 47 minutes.  Residual AHI is 1.7 on 7 to 11 cm of water with EPR of 3.  Leak in the 95th percentile is 10.5.  He reports that the mask was made helpful  REVIEW OF SYSTEMS: Out of a complete 14 system review of symptoms, the patient complains only of the following symptoms, and all other reviewed systems are negative.  ALLERGIES: No Known Allergies  HOME MEDICATIONS: Outpatient Medications Prior to Visit  Medication Sig Dispense Refill   cetirizine (ZYRTEC) 10 MG tablet Take 10 mg by mouth daily.     cyclobenzaprine (FLEXERIL) 10 MG tablet Take 10 mg by mouth as needed.      Fexofenadine HCl (ALLEGRA PO) Take 1 tablet by mouth in the morning. Alternates with zyrtec     HYDROcodone -acetaminophen  (NORCO/VICODIN) 5-325 MG tablet Take 1 tablet by mouth every 6 (six) hours as needed for moderate pain (pain score 4-6). 15 tablet 0   Multiple Vitamins-Minerals (MULTIVITAMIN ADULT PO) Take by mouth.     omeprazole  (PRILOSEC) 20 MG capsule Take 1 capsule (20 mg total) by mouth daily. 90 capsule 3   tirzepatide  (ZEPBOUND ) 7.5 MG/0.5ML Pen Inject 7.5 mg into the skin once a week. 2 mL 11   Facility-Administered Medications Prior to Visit  Medication Dose Route Frequency Provider Last Rate Last Admin   0.9 %  sodium chloride  infusion  500 mL Intravenous Once Nandigam, Kavitha V, MD        PAST MEDICAL HISTORY: Past Medical History:  Diagnosis Date   Allergy    SEASONAL   Asthma    WITH ALLERGIES   Carpal tunnel syndrome on both sides    DDD (degenerative disc disease), lumbosacral 11/08/2015   GERD (gastroesophageal reflux disease)    SOMETIMES   Plantar fasciitis, bilateral    Sleep apnea     PAST SURGICAL HISTORY: Past Surgical History:  Procedure Laterality Date   CARPAL TUNNEL RELEASE Left 09/18/2023   Procedure: LEFT CARPAL TUNNEL RELEASE;  Surgeon: Murrell Drivers, MD;  Location: [facility];  Service: Orthopedics;  Laterality: Left;  30 MIN   COLONOSCOPY     15 PLUS YEARS AGO IN [place] ,UPDATED 05/27/22   TUBES IN EARS     AS A CHILD    FAMILY HISTORY: Family History  Problem Relation Age of Onset   Arthritis Mother    Gallbladder disease Mother    Healthy Father    Gallbladder disease Sister    Cancer Maternal Aunt    Heart disease Maternal Grandfather    Colon cancer Neg Hx    Stomach cancer Neg Hx    Rectal cancer Neg Hx    Esophageal cancer Neg Hx    Liver cancer Neg Hx    Colon polyps Neg Hx    Crohn's disease Neg Hx    Ulcerative colitis Neg Hx     SOCIAL HISTORY: Social History   Socioeconomic History   Marital  status: Married    Spouse name: Tammy   Number of children: 0   Years of education: college   Highest education level: Not on file  Occupational History   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  Tobacco Use   Smoking status: Former    Current packs/day: 0.00    Average packs/day: 1 pack/day for  3.0 years (3.0 ttl pk-yrs)    Types: Cigarettes    Start date: 05/08/1993    Quit date: 05/08/1996    Years since quitting: 28.1    Passive exposure: Past   Smokeless tobacco: Never  Vaping Use   Vaping status: Never Used  Substance and Sexual Activity   Alcohol  use: Yes    Comment: OCCASSIONALY   Drug use: No   Sexual activity: Not on file  Other Topics Concern   Not on file  Social History Narrative   Drinks about 1/2 pot of regular coffee a day, and earlier in the day   Left handed   [employer] employee   Social Drivers of Health   Tobacco Use: Medium Risk (12/21/2023)   Patient History    Smoking Tobacco Use: Former    Smokeless Tobacco Use: Never    Passive Exposure: Past  Physicist, Medical Strain: Not on file  Food Insecurity: Low Risk (10/02/2023)   Received from [facility]   Epic    Within the past 12 months, the food you bought just didn't last and you didn't have money to get more. : Never true    Within the past 12 months, you worried that your food would run out before you got money to buy more: Never true  Transportation Needs: No Transportation Needs (10/02/2023)   Received from [facility]    In the past 12 months, has lack of reliable transportation kept you from medical appointments, meetings, work or from getting things needed for daily living? : No  Physical Activity: Not on file  Stress: Not on file  Social Connections: Not on file  Intimate Partner Violence: Not on file  Depression (EYV7-0): Not on file  Alcohol  Screen: Not on file  Housing: Low Risk (10/02/2023)   Received from [facility]   Epic    Think about the place you live. Do  you have problems with any of the following? Choose all that apply:: None/None on this list    What is your living situation today?: I have a steady place to live  Utilities: Low Risk (10/02/2023)   Received from [facility]   Utilities    In the past 12 months has the electric, gas, oil, or water company threatened to shut off services in your home? : No  Health Literacy: Not on file      PHYSICAL EXAM Generalized: Well developed, in no acute distress   Neurological examination  Mentation: Alert oriented to time, place, history taking. Follows all commands speech and language fluent Cranial nerve II-XII: Facial symmetry noted.   DIAGNOSTIC DATA (LABS, IMAGING, TESTING) - I reviewed patient records, labs, notes, testing and imaging myself where available.  Lab Results  Component Value Date   WBC 11.3 (H) 05/12/2019   HGB 16.9 05/12/2019   HCT 49.9 05/12/2019   MCV 86.5 05/12/2019   PLT 273 05/12/2019      Component Value Date/Time   NA 136 05/12/2019 1528   K 3.6 05/12/2019 1528   CL 103 05/12/2019 1528   CO2 20 (L) 05/12/2019 1528   GLUCOSE 95 05/12/2019 1528   BUN 14 05/12/2019 1528   CREATININE 1.23 05/12/2019 1528   CALCIUM 9.2 05/12/2019 1528   PROT 7.4 05/12/2019 2100   ALBUMIN 4.2 05/12/2019 2100   AST 21 05/12/2019 2100   ALT 28 05/12/2019 2100   ALKPHOS 76 05/12/2019 2100   BILITOT 1.0 05/12/2019 2100   GFRNONAA >60  05/12/2019 1528   GFRAA >60 05/12/2019 1528      ASSESSMENT AND PLAN 48 y.o. year old male  has a past medical history of Allergy, Asthma, Carpal tunnel syndrome on both sides, DDD (degenerative disc disease), lumbosacral (11/08/2015), GERD (gastroesophageal reflux disease), Plantar fasciitis, bilateral, and Sleep apnea. here with:  OSA on CPAP  CPAP compliance suboptimal Residual AHI is good Encouraged patient to continue using CPAP nightly and > 4 hours each night F/U in 1 year or sooner if needed   Duwaine Russell, MSN, NP-C  06/22/2024, 3:08 PM [facility] [address], [address] [place], [place] [address] [phone]  The patient's condition requires frequent monitoring and adjustments in the treatment plan, reflecting the ongoing complexity of care.  This provider is the continuing focal point for all needed services for this condition.

## 2024-06-22 NOTE — Patient Instructions (Signed)
 Continue using CPAP nightly and greater than 4 hours each night If your symptoms worsen or you develop new symptoms please let us  know.

## 2025-06-22 ENCOUNTER — Telehealth: Admitting: Adult Health
